# Patient Record
Sex: Male | Born: 1960 | Race: White | Hispanic: No | Marital: Married | Smoking: Former smoker
Health system: Southern US, Community
[De-identification: ages and names within clinical notes are randomized; demographics above are authoritative.]

## PROBLEM LIST (undated history)

## (undated) DIAGNOSIS — I25110 Atherosclerotic heart disease of native coronary artery with unstable angina pectoris: Secondary | ICD-10-CM

## (undated) DIAGNOSIS — I214 Non-ST elevation (NSTEMI) myocardial infarction: Secondary | ICD-10-CM

## (undated) DIAGNOSIS — Z720 Tobacco use: Secondary | ICD-10-CM

## (undated) DIAGNOSIS — E785 Hyperlipidemia, unspecified: Secondary | ICD-10-CM

## (undated) DIAGNOSIS — I4891 Unspecified atrial fibrillation: Secondary | ICD-10-CM

## (undated) DIAGNOSIS — I10 Essential (primary) hypertension: Secondary | ICD-10-CM

## (undated) HISTORY — DX: Hyperlipidemia, unspecified: E78.5

## (undated) HISTORY — DX: Non-ST elevation (NSTEMI) myocardial infarction: I21.4

## (undated) HISTORY — DX: Essential (primary) hypertension: I10

## (undated) HISTORY — DX: Tobacco use: Z72.0

## (undated) HISTORY — DX: Atherosclerotic heart disease of native coronary artery with unstable angina pectoris: I25.110

---

## 1998-12-10 ENCOUNTER — Emergency Department (HOSPITAL_COMMUNITY): Admission: EM | Admit: 1998-12-10 | Discharge: 1998-12-11 | Payer: Self-pay | Admitting: Emergency Medicine

## 1998-12-10 ENCOUNTER — Encounter: Payer: Self-pay | Admitting: Emergency Medicine

## 2011-07-09 ENCOUNTER — Encounter: Payer: Self-pay | Admitting: Physician Assistant

## 2011-07-09 ENCOUNTER — Ambulatory Visit: Payer: BC Managed Care – PPO | Admitting: Physician Assistant

## 2011-07-09 VITALS — BP 139/100 | HR 92 | Temp 97.9°F | Resp 18 | Ht 70.0 in | Wt 243.4 lb

## 2011-07-09 DIAGNOSIS — I10 Essential (primary) hypertension: Secondary | ICD-10-CM

## 2011-07-09 MED ORDER — HYDROCHLOROTHIAZIDE 12.5 MG PO CAPS: ORAL_CAPSULE | ORAL | Status: DC

## 2011-07-09 NOTE — Progress Notes (Signed)
Subjective:    Patient ID: Paul Yang, male    DOB: 09/03/1960, 51 y.o.   MRN: [id number]  Primary Physician: Eula Listen, PA-C  Chief Complaint: HTN follow up  HPI 51 y.o. y/o Caucasian male with history noted below presents for follow up and management of his hypertension. Taking Lisinopril 10 mg 2 each morning and 1 each night, as well as Norvasc 10 mg daily. Tolerating both medications. At home BP's run around 140's-150's/ 90's-100's. Takes medication daily. Has FAA flight physical in 2 weeks. Still smoking, not ready to quit yet. Could not take Chantix secondary to [employer] regulations. Otherwise doing well, with no issues or complaints. Over due for CPE.  No CP, HA, vision changes, or focal deficits.   Past Medical History  Diagnosis Date  . Hypertension   . Hyperlipidemia   . Tobacco use     Prior to Admission medications   Medication Sig Start Date End Date Taking? Authorizing Provider  amLODipine (NORVASC) 10 MG tablet Take 10 mg by mouth daily.   Yes Historical Provider, MD  lisinopril (PRINIVIL,ZESTRIL) 10 MG tablet Take 10 mg by mouth daily. 2 po q am, 1 po q pm   Yes Historical Provider, MD           No Known Allergies  History   Social History  . Marital Status: Divorced    Spouse Name: N/A    Number of Children: N/A  . Years of Education: N/A   Social History Main Topics  . Smoking status: Current Everyday Smoker  . Smokeless tobacco: None  . Alcohol Use: No  . Drug Use: No  . Sexually Active: Girlfriend   Other Topics Concern  . None   Social History Narrative  . None    Family History  Problem Relation Age of Onset  . Hypertension Mother        Review of Systems  Constitutional: Negative for fever, chills, diaphoresis, activity change, appetite change and fatigue.  Eyes: Negative for visual disturbance.  Respiratory: Negative for cough.   Cardiovascular: Negative for chest pain.  Neurological: Negative for dizziness, light-headedness,  numbness and headaches.       Objective:   Physical Exam  Constitutional: He is oriented to person, place, and time. He appears well-developed and well-nourished. No distress.  HENT:  Head: Normocephalic and atraumatic.  Right Ear: External ear normal.  Left Ear: External ear normal.  Mouth/Throat: Oropharynx is clear and moist.  Eyes: Conjunctivae and EOM are normal. Pupils are equal, round, and reactive to light. Right eye exhibits no discharge. Left eye exhibits no discharge. No scleral icterus.  Neck: Normal range of motion. Neck supple. No tracheal deviation present. No thyromegaly present.  Cardiovascular: Normal rate, regular rhythm and normal heart sounds.  Exam reveals no gallop and no friction rub.   No murmur heard. Pulmonary/Chest: Effort normal and breath sounds normal. No respiratory distress. He has no wheezes. He has no rales. He exhibits no tenderness.  Lymphadenopathy:    He has no cervical adenopathy.  Neurological: He is alert and oriented to person, place, and time.  Skin: Skin is warm and dry. He is not diaphoretic.  Psychiatric: He has a normal mood and affect. His behavior is normal. Judgment and thought content normal.    Labs: Deferred until 07/13/11 visit      Assessment & Plan:  51 y.o. Caucasian male with HTN. 1. Add HCTZ 12.5 mg 1 PO daily for 3 days, then 2 PO  daily, # 180 RF#1 2. Continue Lisinopril 10 mg 2 PO QAM, and 1 PO QPM, has refills at home 3. Continue Norvasc 10 mg daily, has refills at home 4. Stop smoking 5. Recheck BP in 5 days, with at home readings. Plan to check labs at visit on 07/13/11. 6. Needs CPE with fasting blood work.  Signed,  Eula Listen, PA-C 07/09/2011, 2:26 PM

## 2011-07-09 NOTE — Patient Instructions (Signed)

## 2011-07-13 ENCOUNTER — Ambulatory Visit: Payer: BC Managed Care – PPO | Admitting: Physician Assistant

## 2011-07-13 VITALS — BP 118/82 | HR 86 | Temp 98.3°F | Resp 16 | Ht 70.75 in | Wt 244.0 lb

## 2011-07-13 DIAGNOSIS — I10 Essential (primary) hypertension: Secondary | ICD-10-CM

## 2011-07-13 LAB — BASIC METABOLIC PANEL
BUN: 15 mg/dL (ref 6–23)
CO2: 26 mEq/L (ref 19–32)
Calcium: 9.5 mg/dL (ref 8.4–10.5)
Chloride: 102 mEq/L (ref 96–112)
Creat: 1.31 mg/dL (ref 0.50–1.35)
Glucose, Bld: 88 mg/dL (ref 70–99)
Potassium: 4.1 mEq/L (ref 3.5–5.3)
Sodium: 140 mEq/L (ref 135–145)

## 2011-07-13 NOTE — Patient Instructions (Signed)

## 2011-07-13 NOTE — Progress Notes (Signed)
Patient ID: Paul Yang MRN: [id number], DOB: May 28, 1961, 51 y.o. Date of Encounter: 07/13/2011, 2:14 PM  Primary Physician: No primary provider on file.  Chief Complaint: Follow up HTN  HPI: 51 y/o Caucasian male with history below presents for follow up HTN. Doing well. No issues or complaints. Tolerating all medications. Started his HCTZ on 07/09/11. Now up to 25 mg daily of HCTZ. No side effects. BP at home has been 120's/80's. No CP, HA, vision changes, or focal deficits.  Plans to schedule CPE in March.  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    Past Medical History  Diagnosis Date  . Hypertension   . Hyperlipidemia   . Tobacco use      Home Meds: Prior to Admission medications   Medication Sig Start Date End Date Taking? Authorizing Provider  amLODipine (NORVASC) 10 MG tablet Take 10 mg by mouth daily.   Yes Historical Provider, MD  hydrochlorothiazide (MICROZIDE) 12.5 MG capsule 1 PO daily for 3 days then 2 po daily 07/09/11  Yes Abriana Saltos, PA-C  lisinopril (PRINIVIL,ZESTRIL) 10 MG tablet Take 10 mg by mouth daily. 2 po q am, 1 po q pm   Yes Historical Provider, MD    Allergies: No Known Allergies  History   Social History  . Marital Status: Married    Spouse Name: N/A    Number of Children: N/A  . Years of Education: N/A   Occupational History  . Not on file.   Social History Main Topics  . Smoking status: Current Everyday Smoker  . Smokeless tobacco: Not on file  . Alcohol Use: No  . Drug Use: No  . Sexually Active: Not on file   Other Topics Concern  . Not on file   Social History Narrative  . No narrative on file     Review of Systems: Constitutional: negative for chills, fever, night sweats, weight changes, or fatigue  HEENT: negative for vision changes, hearing loss, congestion, rhinorrhea, ST, epistaxis, or sinus pressure Cardiovascular: negative for chest pain or palpitations Respiratory: negative for hemoptysis, wheezing, shortness  of breath, or cough Abdominal: negative for abdominal pain, nausea, vomiting, diarrhea, or constipation Dermatological: negative for rash Neurologic: negative for headache, dizziness, or syncope All other systems reviewed and are otherwise negative with the exception to those above and in the HPI.   Physical Exam: Blood pressure 118/82, pulse 86, temperature 98.3 F (36.8 C), temperature source Oral, resp. rate 16, height 5' 10.75" (1.797 m), weight 244 lb (110.678 kg). Body mass index is 34.27 kg/(m^2). General: Well developed, well nourished, in no acute distress. Head: Normocephalic, atraumatic, eyes without discharge, sclera non-icteric, nares are without discharge. Bilateral auditory canals clear, TM's are without perforation, pearly grey and translucent with reflective cone of light bilaterally. Oral cavity moist, posterior pharynx without exudate, erythema, peritonsillar abscess, or post nasal drip.  Neck: Supple. No thyromegaly. Full ROM. No lymphadenopathy. Lungs: Clear bilaterally to auscultation without wheezes, rales, or rhonchi. Breathing is unlabored. Heart: RRR with S1 S2. No murmurs, rubs, or gallops appreciated. Msk:  Strength and tone normal for age. Extremities/Skin: Warm and dry. No clubbing or cyanosis. No edema. No rashes or suspicious lesions. Neuro: Alert and oriented X 3. Moves all extremities spontaneously. Gait is normal. CNII-XII grossly in tact. Psych:  Responds to questions appropriately with a normal affect.   Labs: Bmet pending. Patient is not fasting.  ASSESSMENT AND PLAN:  51 y.o. Caucasian male with well controlled HTN. 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

## 2011-07-18 ENCOUNTER — Other Ambulatory Visit: Payer: Self-pay | Admitting: Physician Assistant

## 2011-07-18 MED ORDER — HYDROCHLOROTHIAZIDE 12.5 MG PO TABS: 12.5000 mg | ORAL_TABLET | Freq: Every day | ORAL | Status: DC

## 2011-07-18 NOTE — Progress Notes (Addendum)
We are telling the patient that his new HCTZ 12.5 mg tabs are ready for pick up at the pharmacy.  Shaunette Gassner  Done. Please notify patient.  Henrick Mcgue

## 2011-07-20 NOTE — Progress Notes (Signed)
Notified pt that Rx ready for p/up

## 2012-01-14 ENCOUNTER — Encounter: Payer: Self-pay | Admitting: Physician Assistant

## 2012-01-14 ENCOUNTER — Ambulatory Visit (INDEPENDENT_AMBULATORY_CARE_PROVIDER_SITE_OTHER): Payer: BC Managed Care – PPO | Admitting: Physician Assistant

## 2012-01-14 VITALS — BP 138/92 | HR 91 | Temp 98.3°F | Resp 16 | Ht 70.5 in | Wt 242.2 lb

## 2012-01-14 DIAGNOSIS — I10 Essential (primary) hypertension: Secondary | ICD-10-CM

## 2012-01-14 DIAGNOSIS — Z0000 Encounter for general adult medical examination without abnormal findings: Secondary | ICD-10-CM

## 2012-01-14 LAB — POCT URINALYSIS DIPSTICK
Ketones, UA: NEGATIVE
Protein, UA: 30
Spec Grav, UA: >=1.03
pH, UA: 5.5

## 2012-01-14 LAB — POCT UA - MICROSCOPIC ONLY
Bacteria, U Microscopic: NEGATIVE
Crystals, Ur, HPF, POC: NEGATIVE

## 2012-01-14 NOTE — Progress Notes (Signed)
Patient ID: Paul Yang MRN: [id number], DOB: Dec 02, 1960 51 y.o. Date of Encounter: 01/14/2012, 3:36 PM  Primary Physician: No primary provider on file.  Chief Complaint: Physical (CPE)  HPI: 51 y.o. y/o male with history noted below here for CPE. Doing well. No issues/complaints. Taking Norvasc 10 mg daily, and Lisinopril 10 mg 2 po qam daily. Not taking HCTZ secondary to increased urine production and not taking Lisinopril 10 mg at night. Eating healthier. Exercising regularly. Still smoking, but trying to quit, does not want anything for this at this time. Vaccinations up to date. Stress levels are improving. Plans to get his colonoscopy in March 2014 when he turns 51.   Review of Systems: Consitutional: No fever, chills, fatigue, night sweats, lymphadenopathy, or weight changes. Eyes: No visual changes, eye redness, or discharge. ENT/Mouth: Ears: No otalgia, tinnitus, hearing loss, discharge. Nose: No congestion, rhinorrhea, sinus pain, or epistaxis. Throat: No sore throat, post nasal drip, or teeth pain. Cardiovascular: No CP, palpitations, diaphoresis, DOE, edema, orthopnea, PND. Respiratory: No cough, hemoptysis, SOB, or wheezing. Gastrointestinal: No anorexia, dysphagia, reflux, pain, nausea, vomiting, hematemesis, diarrhea, constipation, BRBPR, or melena. Genitourinary: No dysuria, frequency, urgency, hematuria, incontinence, nocturia, decreased urinary stream, discharge, impotence, or testicular pain/masses. Musculoskeletal: No decreased ROM, myalgias, stiffness, joint swelling, or weakness. Skin: No rash, erythema, lesion changes, pain, warmth, jaundice, or pruritis. Neurological: No headache, dizziness, syncope, seizures, tremors, memory loss, coordination problems, or paresthesias. Psychological: No anxiety, depression, hallucinations, SI/HI. Endocrine: No fatigue, polydipsia, polyphagia, polyuria, or known diabetes. All other systems were reviewed and are otherwise  negative.  Past Medical History  Diagnosis Date  . Hypertension   . Hyperlipidemia   . Tobacco use      History reviewed. No pertinent past surgical history.  Home Meds:  Prior to Admission medications   Medication Sig Start Date End Date Taking? Authorizing Provider  amLODipine (NORVASC) 10 MG tablet Take 10 mg by mouth daily.   Yes Historical Provider, MD  lisinopril (PRINIVIL,ZESTRIL) 10 MG tablet Take 10 mg by mouth daily. 2 po q am, 1 po q pm   Yes Historical Provider, MD  Multiple Vitamin (MULTIVITAMIN) tablet Take 1 tablet by mouth daily.   Yes Historical Provider, MD           Allergies: No Known Allergies  History   Social History  . Marital Status: Married    Spouse Name: N/A    Number of Children: N/A  . Years of Education: N/A   Occupational History  . Not on file.   Social History Main Topics  . Smoking status: Current Everyday Smoker  . Smokeless tobacco: Never Used  . Alcohol Use: No  . Drug Use: No  . Sexually Active: Yes -- Male partner(s)   Other Topics Concern  . Not on file   Social History Narrative  . No narrative on file    Family History  Problem Relation Age of Onset  . Hypertension Mother     Physical Exam: Blood pressure 138/92, pulse 91, temperature 98.3 F (36.8 C), temperature source Oral, resp. rate 16, height 5' 10.5" (1.791 m), weight 242 lb 3.2 oz (109.861 kg), SpO2 97.00%.  General: Well developed, well nourished, in no acute distress. HEENT: Normocephalic, atraumatic. Conjunctiva pink, sclera non-icteric. Pupils 2 mm constricting to 1 mm, round, regular, and equally reactive to light and accomodation. EOMI. Internal auditory canal clear. TMs with good cone of light and without pathology. Nasal mucosa pink. Nares are without discharge. No sinus  tenderness. Oral mucosa pink. Dentition normal. Pharynx without exudate.   Neck: Supple. Trachea midline. No thyromegaly. Full ROM. No lymphadenopathy. Lungs: Clear to auscultation  bilaterally without wheezes, rales, or rhonchi. Breathing is of normal effort and unlabored. Cardiovascular: RRR with S1 S2. No murmurs, rubs, or gallops appreciated. Distal pulses 2+ symmetrically. No carotid or abdominal bruits. Abdomen: Soft, non-tender, non-distended with normoactive bowel sounds. No hepatosplenomegaly or masses. No rebound/guarding. No CVA tenderness. Without hernias.  Rectal: No external hemorrhoids or fissures. Rectal vault without masses. Prostate smooth and symmetrical without masses, lesions, or TTP.  Genitourinary: Circumcised male. No penile lesions. Testes descended bilaterally, and smooth without tenderness or masses.  Musculoskeletal: Full range of motion and 5/5 strength throughout. Without swelling, atrophy, tenderness, crepitus, or warmth. Extremities without clubbing, cyanosis, or edema. Calves supple. Skin: Warm and moist without erythema, ecchymosis, wounds, or rash. Neuro: A+Ox3. CN II-XII grossly intact. Moves all extremities spontaneously. Full sensation throughout. Normal gait. DTR 2+ throughout upper and lower extremities. Finger to nose intact. Psych:  Responds to questions appropriately with a normal affect.   Studies:  Results for orders placed in visit on 01/14/12  POCT UA - MICROSCOPIC ONLY      Component Value Range   WBC, Ur, HPF, POC 0-1     RBC, urine, microscopic 0-2     Bacteria, U Microscopic neg     Mucus, UA trace     Epithelial cells, urine per micros 0-1     Crystals, Ur, HPF, POC neg     Casts, Ur, LPF, POC 0-1     Yeast, UA neg    POCT URINALYSIS DIPSTICK      Component Value Range   Color, UA yellow     Clarity, UA clear     Glucose, UA neg     Bilirubin, UA small     Ketones, UA neg     Spec Grav, UA >=1.030     Blood, UA trace     pH, UA 5.5     Protein, UA 30     Urobilinogen, UA 0.2     Nitrite, UA neg     Leukocytes, UA Negative    IFOBT (OCCULT BLOOD)      Component Value Range   IFOBT Negative      CBC,  CMET, Lipid, PSA, TSH all pending. Patient is fasting.   EKG: NSR, no acute changes from previous.   Assessment/Plan:  51 y.o. y/o Caucasian male here for CPE with HTN 1. HTN -Continue current meds -BP improved with recheck today in office -Does not need refills today, will call when he does -Ok to refill through March 2014 -Continue with healthy diet and exercise -Stop smoking  2. CPE -Vaccinations up to date -Await labs, treatment and follow up pending -Plan to schedule colonoscopy March 2014 when turns 52 -Anticipatory guidance   Signed, Eula Listen, PA-C 01/14/2012 3:36 PM

## 2012-01-14 NOTE — Progress Notes (Signed)
  Subjective:    Patient ID: Paul Yang, male    DOB: 05-Jun-1960, 51 y.o.   MRN: [id number]  HPI    Review of Systems  Constitutional: Negative.   HENT: Negative.   Eyes: Negative.   Respiratory: Negative.   Cardiovascular: Negative.   Gastrointestinal: Negative.   Genitourinary: Negative.   Musculoskeletal: Negative.   Skin: Negative.   Neurological: Negative.   Hematological: Negative.   Psychiatric/Behavioral: Negative.        Objective:   Physical Exam        Assessment & Plan:

## 2012-01-15 ENCOUNTER — Other Ambulatory Visit: Payer: Self-pay | Admitting: Physician Assistant

## 2012-01-15 LAB — COMPREHENSIVE METABOLIC PANEL
ALT: 33 U/L (ref 0–53)
AST: 21 U/L (ref 0–37)
Albumin: 4 g/dL (ref 3.5–5.2)
Alkaline Phosphatase: 101 U/L (ref 39–117)
BUN: 12 mg/dL (ref 6–23)
CO2: 25 mEq/L (ref 19–32)
Calcium: 8.8 mg/dL (ref 8.4–10.5)
Chloride: 109 mEq/L (ref 96–112)
Creat: 0.96 mg/dL (ref 0.50–1.35)
Glucose, Bld: 89 mg/dL (ref 70–99)
Potassium: 4 mEq/L (ref 3.5–5.3)
Sodium: 143 mEq/L (ref 135–145)
Total Bilirubin: 0.7 mg/dL (ref 0.3–1.2)
Total Protein: 6.4 g/dL (ref 6.0–8.3)

## 2012-01-15 LAB — CBC
HCT: 46.4 % (ref 39.0–52.0)
Hemoglobin: 16.4 g/dL (ref 13.0–17.0)
MCH: 31 pg (ref 26.0–34.0)
MCHC: 35.3 g/dL (ref 30.0–36.0)
MCV: 87.7 fL (ref 78.0–100.0)
Platelets: 224 10*3/uL (ref 150–400)
RBC: 5.29 MIL/uL (ref 4.22–5.81)
RDW: 14.3 % (ref 11.5–15.5)
WBC: 6.9 10*3/uL (ref 4.0–10.5)

## 2012-01-15 LAB — LIPID PANEL
Cholesterol: 220 mg/dL — ABNORMAL HIGH (ref 0–200)
HDL: 27 mg/dL — ABNORMAL LOW (ref >39)
LDL Cholesterol: 123 mg/dL — ABNORMAL HIGH (ref 0–99)
Total CHOL/HDL Ratio: 8.1 Ratio
Triglycerides: 349 mg/dL — ABNORMAL HIGH (ref <150)
VLDL: 70 mg/dL — ABNORMAL HIGH (ref 0–40)

## 2012-01-15 MED ORDER — ATORVASTATIN CALCIUM 10 MG PO TABS: 10.0000 mg | ORAL_TABLET | Freq: Every day | ORAL | Status: DC

## 2012-05-20 ENCOUNTER — Other Ambulatory Visit: Payer: Self-pay | Admitting: Physician Assistant

## 2012-06-17 ENCOUNTER — Encounter: Payer: Self-pay | Admitting: Family Medicine

## 2012-06-17 ENCOUNTER — Ambulatory Visit: Payer: BC Managed Care – PPO | Admitting: Family Medicine

## 2012-06-17 VITALS — BP 150/100 | HR 75 | Temp 97.8°F | Resp 16 | Ht 71.0 in | Wt 250.6 lb

## 2012-06-17 DIAGNOSIS — E785 Hyperlipidemia, unspecified: Secondary | ICD-10-CM

## 2012-06-17 DIAGNOSIS — E669 Obesity, unspecified: Secondary | ICD-10-CM | POA: Insufficient documentation

## 2012-06-17 DIAGNOSIS — I10 Essential (primary) hypertension: Secondary | ICD-10-CM

## 2012-06-17 MED ORDER — AMLODIPINE BESYLATE 10 MG PO TABS: 10.0000 mg | ORAL_TABLET | Freq: Every day | ORAL | Status: DC

## 2012-06-17 MED ORDER — HYDROCHLOROTHIAZIDE 12.5 MG PO TABS: 12.5000 mg | ORAL_TABLET | Freq: Every day | ORAL | Status: DC

## 2012-06-17 MED ORDER — LISINOPRIL 10 MG PO TABS: 10.0000 mg | ORAL_TABLET | Freq: Every day | ORAL | Status: DC

## 2012-06-17 NOTE — Progress Notes (Signed)
S: This 52 y.o. Cauc make who has HTN; unfortunately, he is not sure what he is supposed to be taking. He has no CP or tightness, palpitations, SOB or DOE, dizziness, weakness, numbness or syncope. He is a Recruitment consultant; he has occasional tension HA w/ pain localized to posterior right neck. This is treated by Chiropractor.  ROS: Noncontributory.  O:  Filed Vitals:   06/17/12 1309  BP: 150/100  Pulse: 75  Temp: 97.8 F (36.6 C)  Resp: 16   GEN: In NAD; WN,WD. HENT: [place]/AT; EOMI w/ clear conj/sclerae. Otherwise unremarkable. COR: RRR. LUNGS: Normal resp rate and effort. NEURO: A&O x 3; Cns intact. Nonfocal.  A?P:  1. HTN (hypertension)                     Reviewed medications; refilled all meds for 90 days but pt must RTC in March 2014 for visit w/ Eula Listen, PA  2. Hyperlipidemia LDL goal < 100       Reviewed pt's lipid results from Aug 2013; advised of elevated CHD risk due to high TChol and LDL chol with low HDL. He states that he needs to make some changes. He will continue to take statin.

## 2012-06-17 NOTE — Patient Instructions (Signed)
I have refilled all BP medications for 90-day supply. However, you will still need to see Paul Yang in March; labs will probably be done at that visit.   For tension HA, it is best if you use OTC topical analgesic (like Aspercreme, Arnica Gel or Flexall); you can apply these 2-3 times a day. You can take Tylenol for pain.  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

## 2012-08-21 ENCOUNTER — Telehealth: Payer: Self-pay

## 2012-08-21 ENCOUNTER — Other Ambulatory Visit: Payer: Self-pay | Admitting: Physician Assistant

## 2012-08-21 DIAGNOSIS — Z1211 Encounter for screening for malignant neoplasm of colon: Secondary | ICD-10-CM

## 2012-08-21 MED ORDER — ATORVASTATIN CALCIUM 10 MG PO TABS: 10.0000 mg | ORAL_TABLET | Freq: Every day | ORAL | Status: DC

## 2012-08-21 NOTE — Telephone Encounter (Signed)
Patient advised.

## 2012-08-21 NOTE — Telephone Encounter (Signed)
Sent in the Lipitor, please advise on Colonoscopy

## 2012-08-21 NOTE — Telephone Encounter (Signed)
Pt is scheduled for a cpe with ryan on 09/08/12. Pt is asking if we can go ahead and get his colonoscopy scheduled before the cpe. Pt also states he is out of his lipitor and is requesting a refill

## 2012-08-21 NOTE — Telephone Encounter (Signed)
GI/colonoscopy referral done. Lipitor refill done.

## 2012-09-04 LAB — CBC AND DIFFERENTIAL
HCT: 45 % (ref 41–53)
Hemoglobin: 15.5 g/dL (ref 13.5–17.5)
Platelets: 246 10*3/uL (ref 150–399)
WBC: 9.1 10^3/mL

## 2012-09-04 LAB — HEPATIC FUNCTION PANEL
ALT: 37 U/L (ref 10–40)
AST: 21 U/L (ref 14–40)
Bilirubin, Direct: 0.1 mg/dL (ref 0.01–0.4)

## 2012-09-04 LAB — BASIC METABOLIC PANEL: Potassium: 3.7 mmol/L (ref 3.4–5.3)

## 2012-09-04 LAB — TSH: TSH: 0.714

## 2012-09-08 ENCOUNTER — Ambulatory Visit (INDEPENDENT_AMBULATORY_CARE_PROVIDER_SITE_OTHER): Payer: BC Managed Care – PPO | Admitting: Physician Assistant

## 2012-09-08 ENCOUNTER — Encounter: Payer: Self-pay | Admitting: Physician Assistant

## 2012-09-08 VITALS — BP 138/88 | HR 98 | Temp 98.0°F | Resp 18 | Ht 70.0 in | Wt 251.2 lb

## 2012-09-08 DIAGNOSIS — I10 Essential (primary) hypertension: Secondary | ICD-10-CM

## 2012-09-08 DIAGNOSIS — Z0000 Encounter for general adult medical examination without abnormal findings: Secondary | ICD-10-CM

## 2012-09-08 DIAGNOSIS — E785 Hyperlipidemia, unspecified: Secondary | ICD-10-CM

## 2012-09-08 LAB — LIPID PANEL
HDL: 26 mg/dL — ABNORMAL LOW (ref >39)
LDL Cholesterol: 117 mg/dL — ABNORMAL HIGH (ref 0–99)

## 2012-09-08 NOTE — Progress Notes (Signed)
Patient ID: AZLAAN ISIDORE MRN: [id number], DOB: [birth year] 52 y.o. Date of Encounter: 09/09/2012, 9:10 AM  Primary Physician: No primary provider on file.  Chief Complaint: Physical (CPE)  HPI: 52 y.o. male with history noted below here for CPE. Doing well. No issues/complaints.   1) Hypertension: Taking Lisinopril 10 mg 1 po daily, HCTZ 12.5 mg 1 po daily, and Norvasc 10 mg 1 po daily. Tolerating medications without adverse effects. Blood pressures outside of our office typically run around 130's/80's, or better. No chest pain, chest tightness, SOB, dizziness, headaches, vision changes, or focal deficits.  2) Hyperlipidemia: Tolerating Lipitor 10 mg daily without issue. No myalgias. Eats a healthy diet. Does try to get some exercise in when he can.   3) Tobacco use: He did quit for 30 days, but picked it back up secondary to stress. Not yet ready to quit again.   4) CPE: Has colonoscopy scheduled for 10/15/12. Influenza vaccine up to date. Tetanus vaccine up to date.    Review of Systems: Consitutional: No fever, chills, fatigue, night sweats, lymphadenopathy, or weight changes. Eyes: No visual changes, eye redness, or discharge. ENT/Mouth: Ears: No otalgia, tinnitus, hearing loss, discharge. Nose: No congestion, rhinorrhea, sinus pain, or epistaxis. Throat: No sore throat, post nasal drip, or teeth pain. Cardiovascular: No CP, palpitations, diaphoresis, DOE, edema, orthopnea, PND. Respiratory: No cough, hemoptysis, SOB, or wheezing. Gastrointestinal: No anorexia, dysphagia, reflux, pain, nausea, vomiting, hematemesis, diarrhea, constipation, BRBPR, or melena. Genitourinary: No dysuria, frequency, urgency, hematuria, incontinence, nocturia, decreased urinary stream, discharge, impotence, or testicular pain/masses. Musculoskeletal: No decreased ROM, myalgias, stiffness, joint swelling, or weakness. Skin: No rash, erythema, lesion changes, pain, warmth, jaundice, or pruritis. Neurological:  No headache, dizziness, syncope, seizures, tremors, memory loss, coordination problems, or paresthesias. Psychological: No anxiety, depression, hallucinations, SI/HI. Endocrine: No fatigue, polydipsia, polyphagia, polyuria, or known diabetes. All other systems were reviewed and are otherwise negative.  Past Medical History  Diagnosis Date  . Hypertension   . Hyperlipidemia   . Tobacco use      History reviewed. No pertinent past surgical history.  Home Meds:  Prior to Admission medications   Medication Sig Start Date End Date Taking? Authorizing Provider  amLODipine (NORVASC) 10 MG tablet Take 1 tablet (10 mg total) by mouth daily. Needs office visit in March 2014 06/17/12  Yes Maurice March, MD  atorvastatin (LIPITOR) 10 MG tablet Take 1 tablet (10 mg total) by mouth daily. 08/21/12  Yes Infinity Jeffords M Isidora Laham, PA-C  hydrochlorothiazide (HYDRODIURIL) 12.5 MG tablet Take 1 tablet (12.5 mg total) by mouth daily. 06/17/12 06/17/13 Yes Maurice March, MD  lisinopril (PRINIVIL,ZESTRIL) 10 MG tablet Take 1 tablet (10 mg total) by mouth daily. 2 po q am, 1 po q pm 06/17/12  Yes Maurice March, MD  Multiple Vitamin (MULTIVITAMIN) tablet Take 1 tablet by mouth daily.   Yes Historical Provider, MD    Allergies: No Known Allergies  History   Social History  . Marital Status: Married    Spouse Name: N/A    Number of Children: N/A  . Years of Education: N/A   Occupational History  . Not on file.   Social History Main Topics  . Smoking status: Current Every Day Smoker  . Smokeless tobacco: Never Used  . Alcohol Use: No  . Drug Use: No  . Sexually Active: Yes -- Male partner(s)   Other Topics Concern  . Not on file   Social History Narrative  . No narrative on  file    Family History  Problem Relation Age of Onset  . Hypertension Mother     Physical Exam: Blood pressure 138/88, pulse 98, temperature 98 F (36.7 C), temperature source Oral, resp. rate 18, height 5\' 10"   (1.778 m), weight 251 lb 3.2 oz (113.944 kg), SpO2 98.00%.  General: Well developed, well nourished, in no acute distress. HEENT: Normocephalic, atraumatic. Conjunctiva pink, sclera non-icteric. Pupils 2 mm constricting to 1 mm, round, regular, and equally reactive to light and accomodation. EOMI. Internal auditory canal clear. TMs with good cone of light and without pathology. Nasal mucosa pink. Nares are without discharge. No sinus tenderness. Oral mucosa pink. Dentition normal. Pharynx without exudate.   Neck: Supple. Trachea midline. No thyromegaly. Full ROM. No lymphadenopathy. Lungs: Clear to auscultation bilaterally without wheezes, rales, or rhonchi. Breathing is of normal effort and unlabored. Cardiovascular: RRR with S1 S2. No murmurs, rubs, or gallops appreciated. Distal pulses 2+ symmetrically. No carotid or abdominal bruits. Abdomen: Soft, non-tender, non-distended with normoactive bowel sounds. No hepatosplenomegaly or masses. No rebound/guarding. No CVA tenderness. Without hernias.  Rectal: Patient declined. Stating this was just performed by Dr. Loreta Ave.   Genitourinary: Circumcised male. No penile lesions. Testes descended bilaterally, and smooth without tenderness or masses.  Musculoskeletal: Full range of motion and 5/5 strength throughout. Without swelling, atrophy, tenderness, crepitus, or warmth. Extremities without clubbing, cyanosis, or edema. Calves supple. Skin: Warm and moist without erythema, ecchymosis, wounds, or rash. Neuro: A+Ox3. CN II-XII grossly intact. Moves all extremities spontaneously. Full sensation throughout. Normal gait. DTR 2+ throughout upper and lower extremities. Finger to nose intact. Psych:  Responds to questions appropriately with a normal affect.   Studies:  Will be abstracted in.  Reviewed and all unremarkable.  BMP, CBC, LFT, TSH, and PSA. FLP pending  EKG: NSR  Assessment/Plan:  52 y.o. caucasian male here for CPE with history of hypertension,  hyperlipidemia, and tobacco use. 1) Hypertension  -Continue current treatment  -Lisinopril 10 mg 2 po q am, 1 po q pm #270 RF 1 -HCTZ 12.5 1 po daily #90 RF 1 -Norvasc 10 mg 1 po daily #90 RF 1 -Healthy diet and exercise -Weight loss  2) Hyperlipidemia -Await lipid panel -Lipitor 10 mg 1 po daily #30 RF 11 -Healthy diet and exercise -Weight loss  3) Tobacco use -Counseled patient on smoking cessation  4) CPE -Colonoscopy scheduled for 10/15/12 -Influenza up to date -Tetanus -Labs drawn by Dr. Loreta Ave to be abstracted in  Signed, Eula Listen, PA-C 09/09/2012 9:10 AM

## 2012-09-09 ENCOUNTER — Encounter: Payer: Self-pay | Admitting: Physician Assistant

## 2012-09-09 MED ORDER — AMLODIPINE BESYLATE 10 MG PO TABS: 10.0000 mg | ORAL_TABLET | Freq: Every day | ORAL | Status: DC

## 2012-09-09 MED ORDER — HYDROCHLOROTHIAZIDE 12.5 MG PO TABS: 12.5000 mg | ORAL_TABLET | Freq: Every day | ORAL | Status: AC

## 2012-09-09 MED ORDER — LISINOPRIL 10 MG PO TABS: 10.0000 mg | ORAL_TABLET | Freq: Every day | ORAL | Status: DC

## 2012-09-09 MED ORDER — ATORVASTATIN CALCIUM 10 MG PO TABS: 10.0000 mg | ORAL_TABLET | Freq: Every day | ORAL | Status: DC

## 2012-09-10 ENCOUNTER — Encounter: Payer: Self-pay | Admitting: Physician Assistant

## 2012-09-17 ENCOUNTER — Encounter: Payer: Self-pay | Admitting: Physician Assistant

## 2012-11-06 ENCOUNTER — Telehealth: Payer: Self-pay | Admitting: Radiology

## 2012-11-06 NOTE — Telephone Encounter (Signed)
Patient advised the area removed from colonoscopy benign/  polyp

## 2012-11-28 ENCOUNTER — Encounter: Payer: Self-pay | Admitting: Physician Assistant

## 2013-03-05 NOTE — Progress Notes (Signed)
Pt dropped off [employer] form for ins. I completed and faxed to ins except for waist circumference not measured at OV. Pt stated he wasn't worried about the waist circ and req'd that I send in w/out. Got confirmation on fax and scanned. Mailed original to pt w/confirmation of fax.

## 2013-03-06 ENCOUNTER — Ambulatory Visit: Payer: BC Managed Care – PPO | Admitting: Family Medicine

## 2013-11-23 ENCOUNTER — Emergency Department (HOSPITAL_COMMUNITY)
Admission: EM | Admit: 2013-11-23 | Discharge: 2013-11-23 | Disposition: A | Discharge status: Home / Self Care | Payer: Worker's Compensation | Attending: Emergency Medicine | Admitting: Emergency Medicine

## 2013-11-23 ENCOUNTER — Other Ambulatory Visit: Payer: Self-pay | Admitting: Physician Assistant

## 2013-11-23 DIAGNOSIS — I10 Essential (primary) hypertension: Secondary | ICD-10-CM

## 2013-11-23 DIAGNOSIS — Z7982 Long term (current) use of aspirin: Secondary | ICD-10-CM | POA: Insufficient documentation

## 2013-11-23 DIAGNOSIS — Y9389 Activity, other specified: Secondary | ICD-10-CM | POA: Insufficient documentation

## 2013-11-23 DIAGNOSIS — Y9289 Other specified places as the place of occurrence of the external cause: Secondary | ICD-10-CM | POA: Insufficient documentation

## 2013-11-23 DIAGNOSIS — Z79899 Other long term (current) drug therapy: Secondary | ICD-10-CM | POA: Insufficient documentation

## 2013-11-23 DIAGNOSIS — IMO0002 Reserved for concepts with insufficient information to code with codable children: Secondary | ICD-10-CM | POA: Insufficient documentation

## 2013-11-23 DIAGNOSIS — Y990 Civilian activity done for income or pay: Secondary | ICD-10-CM | POA: Insufficient documentation

## 2013-11-23 DIAGNOSIS — W010XXA Fall on same level from slipping, tripping and stumbling without subsequent striking against object, initial encounter: Secondary | ICD-10-CM | POA: Insufficient documentation

## 2013-11-23 DIAGNOSIS — M62830 Muscle spasm of back: Secondary | ICD-10-CM

## 2013-11-23 DIAGNOSIS — F17200 Nicotine dependence, unspecified, uncomplicated: Secondary | ICD-10-CM | POA: Insufficient documentation

## 2013-11-23 DIAGNOSIS — E785 Hyperlipidemia, unspecified: Secondary | ICD-10-CM | POA: Insufficient documentation

## 2013-11-23 MED ORDER — IBUPROFEN 600 MG PO TABS: 600.0000 mg | ORAL_TABLET | Freq: Four times a day (QID) | ORAL | Status: DC | PRN

## 2013-11-23 MED ORDER — OXYCODONE-ACETAMINOPHEN 5-325 MG PO TABS: 1.0000 | ORAL_TABLET | ORAL | Status: DC | PRN

## 2013-11-23 MED ORDER — METHOCARBAMOL 500 MG PO TABS
1000.0000 mg | ORAL_TABLET | Freq: Once | ORAL | Status: AC
  Administered 2013-11-23: 1000 mg via ORAL
  Filled 2013-11-23: qty 2

## 2013-11-23 MED ORDER — KETOROLAC TROMETHAMINE 60 MG/2ML IM SOLN
60.0000 mg | Freq: Once | INTRAMUSCULAR | Status: AC
  Administered 2013-11-23: 60 mg via INTRAMUSCULAR
  Filled 2013-11-23: qty 2

## 2013-11-23 MED ORDER — METHOCARBAMOL 500 MG PO TABS: 1000.0000 mg | ORAL_TABLET | Freq: Two times a day (BID) | ORAL | Status: DC | PRN

## 2013-11-23 MED ORDER — OXYCODONE-ACETAMINOPHEN 5-325 MG PO TABS
1.0000 | ORAL_TABLET | Freq: Once | ORAL | Status: AC
  Administered 2013-11-23: 1 via ORAL
  Filled 2013-11-23: qty 1

## 2013-11-23 NOTE — Discharge Instructions (Signed)

## 2013-11-23 NOTE — ED Notes (Signed)
Pt arrived to the ED with a complaint of lower back pain.  Pt had an accident at work. Pt slipped on some spilt liquid and did the splits and sprained his back.  Pt is experiencing back spasms since.  Pt had such bad back pain earlier this am that he was unable to get up from the floor.

## 2013-11-23 NOTE — ED Provider Notes (Signed)
CSN: [id number]     Arrival date & time 11/23/13  0430 History   First MD Initiated Contact with Patient 11/23/13 [phone]     Chief Complaint  Patient presents with  . Back Pain     (Consider location/radiation/quality/duration/timing/severity/associated sxs/prior Treatment) HPI Patient presents with 2 days of low back pain with spasms. He states it started when he slipped and fell on the floor while at work. States he did not strike his back but caught himself. He began having right-sided greater than left-sided low back spasms. He denied any urinary or bowel incontinence. No fever or chills. He's had no numbness or weakness in his legs. States the pain is exacerbated by movement. Past Medical History  Diagnosis Date  . Hypertension   . Hyperlipidemia   . Tobacco use    No past surgical history on file. Family History  Problem Relation Age of Onset  . Hypertension Mother    History  Substance Use Topics  . Smoking status: Current Every Day Smoker  . Smokeless tobacco: Never Used  . Alcohol Use: No    Review of Systems  Constitutional: Negative for fever and chills.  Respiratory: Negative for shortness of breath.   Cardiovascular: Negative for chest pain.  Gastrointestinal: Negative for nausea, vomiting and abdominal pain.  Genitourinary: Negative for difficulty urinating.  Musculoskeletal: Positive for back pain and myalgias. Negative for neck pain and neck stiffness.  Skin: Negative for rash and wound.  Neurological: Negative for dizziness, weakness, light-headedness, numbness and headaches.  All other systems reviewed and are negative.     Allergies  Review of patient's allergies indicates no known allergies.  Home Medications   Prior to Admission medications   Medication Sig Start Date End Date Taking? Authorizing Elleana Stillson  amLODipine (NORVASC) 10 MG tablet Take 1 tablet (10 mg total) by mouth daily. 09/09/12   Sondra Bargesyan M Dunn, PA-C  aspirin 81 MG tablet Take 81 mg by  mouth daily.    Historical Beckey Polkowski, MD  atorvastatin (LIPITOR) 10 MG tablet Take 1 tablet (10 mg total) by mouth daily. 09/09/12   Sondra Bargesyan M Dunn, PA-C  hydrochlorothiazide (HYDRODIURIL) 12.5 MG tablet Take 1 tablet (12.5 mg total) by mouth daily. 09/09/12 09/09/13  Ryan M Dunn, PA-C  lisinopril (PRINIVIL,ZESTRIL) 10 MG tablet Take 1 tablet (10 mg total) by mouth daily. 2 po q am, 1 po q pm 09/09/12   Sondra Bargesyan M Dunn, PA-C  Multiple Vitamin (MULTIVITAMIN) tablet Take 1 tablet by mouth daily.    Historical Jinnie Onley, MD   BP 206/112  Pulse 69  Temp(Src) 96.2 F (35.7 C) (Oral)  Resp 18  SpO2 100% Physical Exam  Nursing note and vitals reviewed. Constitutional: He is oriented to person, place, and time. He appears well-developed and well-nourished.  Patient appears uncomfortable  HENT:  Head: Normocephalic and atraumatic.  Mouth/Throat: Oropharynx is clear and moist.  Eyes: EOM are normal. Pupils are equal, round, and reactive to light.  Neck: Normal range of motion. Neck supple.  No posterior midline cervical tenderness to palpation.  Cardiovascular: Normal rate and regular rhythm.  Exam reveals no gallop and no friction rub.   No murmur heard. Pulmonary/Chest: Effort normal and breath sounds normal. No respiratory distress. He has no wheezes. He has no rales. He exhibits no tenderness.  Abdominal: Soft. Bowel sounds are normal. He exhibits no distension and no mass. There is no tenderness. There is no rebound and no guarding.  Musculoskeletal: Normal range of motion. He exhibits no edema  and no tenderness.  Tenderness to palpation in the right lumbar paraspinal muscles. Spasm appreciated. Bilateral negative straight-leg raise. Distal pulses intact.  Neurological: He is alert and oriented to person, place, and time.  5/5 motor in all extremities. Sensation is intact.  Skin: Skin is warm and dry. No rash noted. No erythema.  Psychiatric: He has a normal mood and affect. His behavior is normal.    ED  Course  Procedures (including critical care time) Labs Review Labs Reviewed - No data to display  Imaging Review No results found.   EKG Interpretation None      MDM   Final diagnoses:  None     Patient states his back spasms have improved. Patient continues to be neurologically stable. Blood pressures improved but still remains elevated. He states he is going home to take his hypertensive medication. He is advised to followup with his primary Dr. regarding his blood pressure. Return precautions given  Loren Raceravid Yelverton, MD 11/23/13 [phone]

## 2014-07-21 ENCOUNTER — Telehealth: Payer: Self-pay

## 2014-07-27 ENCOUNTER — Encounter: Payer: Self-pay | Admitting: Family Medicine

## 2014-07-27 ENCOUNTER — Ambulatory Visit: Payer: Self-pay | Admitting: Emergency Medicine

## 2014-07-27 ENCOUNTER — Encounter: Payer: BLUE CROSS/BLUE SHIELD | Admitting: Family Medicine

## 2014-07-27 NOTE — Progress Notes (Signed)
  This encounter was created in error - please disregard.  Pt rescheduled; he had to leave before being seen.

## 2014-08-05 ENCOUNTER — Other Ambulatory Visit: Payer: Self-pay | Admitting: Physician Assistant

## 2014-08-06 ENCOUNTER — Other Ambulatory Visit: Payer: Self-pay | Admitting: Physician Assistant

## 2014-12-09 ENCOUNTER — Other Ambulatory Visit: Payer: Self-pay | Admitting: Physician Assistant

## 2015-02-18 DIAGNOSIS — E559 Vitamin D deficiency, unspecified: Secondary | ICD-10-CM | POA: Insufficient documentation

## 2015-04-11 NOTE — Telephone Encounter (Signed)
ERROR

## 2015-09-29 ENCOUNTER — Emergency Department (HOSPITAL_BASED_OUTPATIENT_CLINIC_OR_DEPARTMENT_OTHER)
Admission: EM | Admit: 2015-09-29 | Discharge: 2015-09-29 | Disposition: A | Discharge status: Home / Self Care | Payer: BLUE CROSS/BLUE SHIELD | Attending: Emergency Medicine | Admitting: Emergency Medicine

## 2015-09-29 ENCOUNTER — Telehealth: Payer: Self-pay | Admitting: *Deleted

## 2015-09-29 ENCOUNTER — Ambulatory Visit (INDEPENDENT_AMBULATORY_CARE_PROVIDER_SITE_OTHER): Payer: BLUE CROSS/BLUE SHIELD | Admitting: Physician Assistant

## 2015-09-29 ENCOUNTER — Encounter (HOSPITAL_BASED_OUTPATIENT_CLINIC_OR_DEPARTMENT_OTHER): Payer: Self-pay | Admitting: *Deleted

## 2015-09-29 VITALS — BP 142/102 | HR 103 | Temp 98.3°F | Resp 20 | Ht 70.75 in | Wt 252.6 lb

## 2015-09-29 DIAGNOSIS — Z87891 Personal history of nicotine dependence: Secondary | ICD-10-CM | POA: Diagnosis not present

## 2015-09-29 DIAGNOSIS — H5789 Other specified disorders of eye and adnexa: Secondary | ICD-10-CM

## 2015-09-29 DIAGNOSIS — H5712 Ocular pain, left eye: Secondary | ICD-10-CM | POA: Insufficient documentation

## 2015-09-29 DIAGNOSIS — Z5321 Procedure and treatment not carried out due to patient leaving prior to being seen by health care provider: Secondary | ICD-10-CM | POA: Insufficient documentation

## 2015-09-29 DIAGNOSIS — I10 Essential (primary) hypertension: Secondary | ICD-10-CM | POA: Diagnosis not present

## 2015-09-29 DIAGNOSIS — E785 Hyperlipidemia, unspecified: Secondary | ICD-10-CM | POA: Diagnosis not present

## 2015-09-29 DIAGNOSIS — H578 Other specified disorders of eye and adnexa: Secondary | ICD-10-CM | POA: Diagnosis not present

## 2015-09-29 MED ORDER — FLUORESCEIN SODIUM 1 MG OP STRP: 1.0000 | ORAL_STRIP | Freq: Once | OPHTHALMIC | Status: DC

## 2015-09-29 MED ORDER — TETRACAINE HCL 0.5 % OP SOLN: 2.0000 [drp] | Freq: Once | OPHTHALMIC | Status: DC

## 2015-09-29 NOTE — Telephone Encounter (Signed)
Pt called to state that his eye is now hurting really bad.  Per Maralyn SagoSarah pt needs to use eye drops, use gel during the day and ointment at night.  Pt was not satisfied and will head over the ER.

## 2015-09-29 NOTE — ED Notes (Addendum)
Pt c/o left eye pain ? Foreign body, seen at [facility]D today for same , given eye drops told no foreign body , but states "im not satisfied  i know theres something in it"

## 2015-09-29 NOTE — ED Notes (Signed)
In assessing the patient and the pateint states that his eye feels fine now. He reports that he no longer wants to be seen and if he continues to have problems he will come back tomorrow

## 2015-09-29 NOTE — Patient Instructions (Addendum)
For persistent eye irritation, take OTC ibuprofen and use on OTC eye drops for dry eyes (Systane is my favorite, Natural Tears is another good one).    IF you received an x-ray today, you will receive an invoice from [facility]. Please contact [facility] at [phone] with questions or concerns regarding your invoice.   IF you received labwork today, you will receive an invoice from [facility]. Please contact [facility] at [phone] with questions or concerns regarding your invoice.   Our billing staff will not be able to assist you with questions regarding bills from these companies.  You will be contacted with the lab results as soon as they are available. The fastest way to get your results is to activate your My Chart account. Instructions are located on the last page of this paperwork. If you have not heard from us regarding the results in 2 weeks, please contact this office.

## 2015-09-29 NOTE — Progress Notes (Signed)
Patient ID: Paul Yang, male     DOB: 12/02/1960, 55 y.o.    MRN: [id number]  PCP: Paul Sago, MD  Chief Complaint  Patient presents with  . Foreign Body in Eye    LT Eye Burning, itching  x 1 1/2 hr ago    Subjective:    HPI  Presents for evaluation of possible FB in the LEFT eye.  Today he was driving and felt like something blew into his eye. He's been to a fire department to irrigate it, but without benefit.  No change in his vision. The eye feels irritated, and he's having increased tearing. No other drainage. He reports that he can feel something under the outer portion of the upper lid.   Prior to Admission medications   Medication Sig Start Date End Date Taking? Authorizing Yang  amLODipine (NORVASC) 10 MG tablet TAKE 1 TABLET BY MOUTH DAILY "OV NEEDED FOR ADDITIONAL REFILLS" 08/08/14  Yes Paul Dansereau, PA-C  aspirin 81 MG tablet Take 81 mg by mouth daily.   Yes Paul Provider, MD  atorvastatin (LIPITOR) 10 MG tablet TAKE 1 TABLET BY MOUTH DAILY.  "NO MORE REFILLS WITHOUT OV" 12/10/14  Yes Paul Riddle, PA-C  lisinopril (PRINIVIL,ZESTRIL) 10 MG tablet TAKE 2 TABLETS BY MOUTH EVERY MORNING, AND 1 TABLET EVERY EVENING.  "OV NEEDED FOR ADDITIONAL REFILLS" 08/08/14  Yes Paul Medders, PA-C  hydrochlorothiazide (HYDRODIURIL) 12.5 MG tablet Take 1 tablet (12.5 mg total) by mouth daily. Patient not taking: Reported on 07/27/2014 09/09/12 09/09/13  Paul Barges, PA-C     No Known Allergies   Patient Active Problem List   Diagnosis Date Noted  . Avitaminosis D 02/18/2015  . Obesity (BMI 30.0-34.9) 06/17/2012  . Hyperlipidemia LDL goal < 100 06/17/2012  . Hypertension      Family History  Problem Relation Age of Onset  . Hypertension Mother      Social History   Social History  . Marital Status: Married    Spouse Name: Paul Yang  . Number of Children: 0  . Years of Education: N/A   Occupational History  . Pilot    Social History Main  Topics  . Smoking status: Former Games developer  . Smokeless tobacco: Never Used  . Alcohol Use: No  . Drug Use: No  . Sexual Activity:    Partners: Female   Other Topics Concern  . Not on file   Social History Narrative   Lives with his wife and her two sons.        Review of Systems  Constitutional: Negative for fever, chills and fatigue.  HENT: Negative for congestion.   Eyes: Positive for pain. Negative for photophobia, discharge, redness, itching and visual disturbance.  Neurological: Negative for dizziness, weakness and headaches.         Objective:  Physical Exam  Constitutional: He is oriented to person, place, and time. He appears well-developed and well-nourished.  BP 142/102 mmHg  Pulse 103  Temp(Src) 98.3 F (36.8 C) (Oral)  Resp 20  Ht 5' 10.75" (1.797 m)  Wt 252 lb 9.6 oz (114.579 kg)  BMI 35.48 kg/m2  SpO2 98%   HENT:  Head: Normocephalic and atraumatic.  Eyes: Conjunctivae, EOM and lids are normal. Pupils are equal, round, and reactive to light. Right eye exhibits no chemosis, no discharge, no exudate and no hordeolum. Left eye exhibits no chemosis, no discharge, no exudate and no hordeolum. No scleral icterus.  Visual Acuity in Right Eye -  Without correction: 20/20 Visual Acuity in Left Eye - Without correction: 20/30    Visual Acuity in Both Eyes -Without correction: 20/20  Local anesthesia with proparacaine. Fluorescein. No areas of increased uptake. No FB seen. Lid everted and swept. Irrigated with saline.        Neck: Normal range of motion. Neck supple. No thyromegaly present.  Pulmonary/Chest: Effort normal.  Lymphadenopathy:    He has no cervical adenopathy.  Neurological: He is alert and oriented to person, place, and time.  Skin: Skin is warm and dry.  Psychiatric: He has a normal mood and affect. His speech is normal and behavior is normal.             Assessment & Plan:  1. Irritation of left eye No FB found. No evidence of a  corneal abrasion. Advised that there may be some persistent irritation from a FB that is no longer present. Advised eye hydrating drops (Systane or Natural tears) and OTC ibuprofen. He may also irrigate the eye with saline if needed. If not improving, or if worsening, RTC or seek eye specialist evaluation.   Paul Brashelle S. Kyel Purk, PA-C Physician Assistant-Certified [facility]

## 2016-02-20 ENCOUNTER — Ambulatory Visit
Admission: RE | Admit: 2016-02-20 | Discharge: 2016-02-20 | Disposition: A | Discharge status: Home / Self Care | Payer: BLUE CROSS/BLUE SHIELD | Source: Ambulatory Visit | Attending: Physician Assistant | Admitting: Physician Assistant

## 2016-02-20 ENCOUNTER — Other Ambulatory Visit: Payer: Self-pay | Admitting: Physician Assistant

## 2016-02-20 DIAGNOSIS — M25571 Pain in right ankle and joints of right foot: Secondary | ICD-10-CM

## 2016-03-25 DIAGNOSIS — I25110 Atherosclerotic heart disease of native coronary artery with unstable angina pectoris: Secondary | ICD-10-CM

## 2016-03-25 DIAGNOSIS — I214 Non-ST elevation (NSTEMI) myocardial infarction: Secondary | ICD-10-CM | POA: Insufficient documentation

## 2016-03-25 HISTORY — DX: Atherosclerotic heart disease of native coronary artery with unstable angina pectoris: I25.110

## 2016-03-25 HISTORY — DX: Non-ST elevation (NSTEMI) myocardial infarction: I21.4

## 2016-03-27 HISTORY — PX: CORONARY STENT PLACEMENT: SHX1402

## 2016-03-27 HISTORY — PX: OTHER SURGICAL HISTORY: SHX169

## 2016-03-27 HISTORY — PX: TRANSTHORACIC ECHOCARDIOGRAM: SHX275

## 2016-04-10 ENCOUNTER — Telehealth: Payer: Self-pay | Admitting: Cardiology

## 2016-04-10 NOTE — Telephone Encounter (Signed)
[facility] for appointment on 04/18/16 with Dr Herbie BaltimoreHarding.  [facility] for Dr Elissa HeftyHarding's schedule on 04/18/16. lp

## 2016-04-18 ENCOUNTER — Ambulatory Visit (INDEPENDENT_AMBULATORY_CARE_PROVIDER_SITE_OTHER): Payer: BLUE CROSS/BLUE SHIELD | Admitting: Cardiology

## 2016-04-18 ENCOUNTER — Encounter: Payer: Self-pay | Admitting: Cardiology

## 2016-04-18 VITALS — BP 131/88 | HR 57 | Ht 71.0 in | Wt 242.4 lb

## 2016-04-18 DIAGNOSIS — I25110 Atherosclerotic heart disease of native coronary artery with unstable angina pectoris: Secondary | ICD-10-CM

## 2016-04-18 DIAGNOSIS — I214 Non-ST elevation (NSTEMI) myocardial infarction: Secondary | ICD-10-CM

## 2016-04-18 DIAGNOSIS — E785 Hyperlipidemia, unspecified: Secondary | ICD-10-CM

## 2016-04-18 DIAGNOSIS — I10 Essential (primary) hypertension: Secondary | ICD-10-CM

## 2016-04-18 NOTE — Patient Instructions (Addendum)
NO CHANGE WITH CURRENT MEDICATIONS    Your physician recommends that you schedule a follow-up appointment in:FEB 2018 WITH DR HARDING.   If you need a refill on your cardiac medications before your next appointment, please call your pharmacy.

## 2016-04-18 NOTE — Progress Notes (Signed)
PCP: Paul IraniSPENCER,[name], PA-Yang  Clinic Note: Chief Complaint  Patient presents with  . New Patient (Initial Visit)    Pt states no Sx. Establishing care.     HPI: Paul Yang is a 55 y.o. male with a PMH notable for recent MI-CAD with prior h/o HTN & HLD who presents today for reported history of coronary disease - 100% rPDA - POBA of mrPDA & & DES PCI of dRCA 03/27/16 with moderate LAD &Cx disease.  Paul Yang was discharged after his non-STEMI with PCI to the RCA with plans to follow-up with a Myoview to investigate the LAD. It is now been scheduled for November 24 and then he will see a cardiologist in [place] to follow-up the results of the month Myoview. Once he has this episode completed, he then will keep maintain his follow-up done here.  Recent Hospitalizations: on October 22 he developed substernal chest pain described as indigestion sensation in the need to belch. No dyspnea or diaphoresis. Not obviously worsened by position change or exertion. Was noted to have severely elevated blood pressure of greater than 200 mmHg it did not resolve with nitroglycerin. He was therefore taken to the ER and blood pressure had gone down to 160s. EKG showed significant ST segment changes (borderline inferior ST elevation with positive troponin.  He taken a cardiac Cath Lab on October 24 and underwent intervention on his RPDA in RCA. As the results of his MI, he is grounded for 90 days post MI. In a preliminary reading of the requirements for returning to duty, it appears they ([employer]) require a LHC within 3 mo of discharge.  -- Amlodipine increased to 10 mg discharge.  Smoking cessation counseling -- Plan was [place] complete [place] in [place] where the initial event occurred and then [place]  Studies Reviewed:    LHC 03/27/16:   100% occlusion of the mid right posterior descending artery, treated with POBA.   Severe stenosis (80%) of the distal RCA treated  with 1 DES.   Stenosis (70%) of the mid LAD, and moderate stenosis (60%) of the mid LCx. -->Unclear if LAD dz will require PCI.  Plan was to check outpatient stress test to determine if PCI to the LAD was warranted.  (This was tentatively scheduled for November 24)   TTE results: EF of 50-55%, with low normal global systolic left ventricular function. Grade 1 diastolic dysfunction. Mild concentric LVH.  Interval History: Paul Yang presents today actually feeling quite well. He has not had any resting or exertional chest pain or discomfort since. He didn't notice chest discomfort per se as his angina, he really describes it as a indigestion gas type sensation that pretty much went away after doing a GI cocktail. He was surprised to find out that his troponins were positive and that he had two-vessel disease on cath. He has not had any further symptoms since the cath. He has not had any PND, orthopnea or edema.  He has not yet gotten back into doing any routine exercise, and is questioning when he can start activity. No resting or exertional dyspnea.  No palpitations, lightheadedness, dizziness, weakness or syncope/near syncope. No TIA/amaurosis fugax symptoms. No melena, hematochezia, hematuria, or epstaxis. No claudication.  ROS: A comprehensive was performed. Review of Systems  Constitutional: Negative for fever and weight loss.  HENT: Negative for congestion and nosebleeds.   Respiratory: Negative for cough, shortness of breath and wheezing.   Cardiovascular: Negative.  Negative for  chest pain and claudication.  Gastrointestinal: Negative for blood in stool and constipation.  Genitourinary: Negative for hematuria.  Musculoskeletal: Negative for joint pain.  Skin: Negative.   Neurological: Negative for dizziness and seizures.  Endo/Heme/Allergies: Negative for environmental allergies.  Psychiatric/Behavioral: Negative for depression and memory loss. The patient is not nervous/anxious and  does not have insomnia.   All other systems reviewed and are negative.   Past Medical History:  Diagnosis Date  . 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  Severe stenosis (80%) of the distal RCA treated with 1 DES.  Stenosis (70%) of the mid LAD, and moderate stenosis (60%) of the mid LCx. -->Unclear if LAD dz will require PCI.  Plan was to check outpatient stress test to determine if PCI to the LAD was warranted.  (This was tentatively scheduled for November 15)   . Hyperlipidemia   . Hypertension   . Non-STEMI (non-ST elevated myocardial infarction) [facility]) 03/25/2016   [facility] in [place] (Dr. Doffing Callas): ? if presentation was STEMI or NSTEMI: 100% rPDA (POBA) & 80% dRCA (DES PCI), residual ~70% LAD & 60% Cx.  . Tobacco use     Past Surgical History:  Procedure Laterality Date  . CORONARY STENT PLACEMENT  03/27/2016   [facility], [place], [place]:: POBA of mRPDA, DES PCI of dRCA (80%)  . Left Heart Catheterization  03/27/2016   [facility], [place], [place]: Unable to complete via radial access, switched to right groin. 100% rPDA, 80% dRCA, 70% mLAD, 60% mCx --> POBA/PCI PDA/dRCA  . TRANSTHORACIC ECHOCARDIOGRAM  03/27/2016   [facility], [place], [place]:EF of 50-55%, with low normal global systolic left ventricular function. Grade 1 diastolic dysfunction. Mild concentric LVH..     Current Meds  Medication Sig  . amLODipine (NORVASC) 10 MG tablet TAKE 1 TABLET BY MOUTH DAILY "OV NEEDED FOR ADDITIONAL REFILLS"  . aspirin 81 MG tablet Take 81 mg by mouth daily.  Marland Kitchen atorvastatin (LIPITOR) 80 MG tablet Take 80 mg by mouth daily.  . clopidogrel (PLAVIX) 75 MG tablet Take by mouth.  . hydrochlorothiazide (HYDRODIURIL) 12.5 MG tablet Take 1 tablet (12.5 mg total) by mouth daily.  Marland Kitchen lisinopril  (PRINIVIL,ZESTRIL) 40 MG tablet Take 40 mg by mouth daily.  . metoprolol tartrate (LOPRESSOR) 25 MG tablet Take by mouth.  . nitroGLYCERIN (NITROSTAT) 0.4 MG SL tablet Place under the tongue.  . [DISCONTINUED] atorvastatin (LIPITOR) 10 MG tablet TAKE 1 TABLET BY MOUTH DAILY.  "NO MORE REFILLS WITHOUT OV"  . [DISCONTINUED] lisinopril (PRINIVIL,ZESTRIL) 10 MG tablet TAKE 2 TABLETS BY MOUTH EVERY MORNING, AND 1 TABLET EVERY EVENING.  "OV NEEDED FOR ADDITIONAL REFILLS"    No Known Allergies   Social History   Social History  . Marital status: Married    Spouse name: Paul Yang  . Number of children: 0  . Years of education: N/A   Occupational History  . Aeronautical engineer for [employer]   Social History Main Topics  . Smoking status: Former Games developer  . Smokeless tobacco: Never Used  . Alcohol use No  . Drug use: No  . Sexual activity: Yes    Partners: Female   Other Topics Concern  . None   Social History Narrative   Lives with his wife and her two sons.    Family History  Problem  Relation Age of Onset  . Hypertension Mother   . Heart attack Father 50    Wt Readings from Last 3 Encounters:  04/18/16 110 kg (242 lb 6.4 oz)  09/29/15 109.8 kg (242 lb)  09/29/15 114.6 kg (252 lb 9.6 oz)    PHYSICAL EXAM BP 131/88   Pulse (!) 57   Ht 5\' 11"  (1.803 m)   Wt 110 kg (242 lb 6.4 oz)   BMI 33.81 kg/m  General appearance: alert, cooperative, appears stated age, no distress and mildly obese; well groomed. Healthy appearing  Neck: no adenopathy, no carotid bruit and no JVD Lungs: clear to auscultation bilaterally, normal percussion bilaterally and non-labored Heart: regular rate and rhythm, S1& S2 normal, no murmur, click, rub or gallop; non-displaced PMI Abdomen: soft, non-tender; bowel sounds normal; no masses,  no organomegaly; No HJR Extremities: extremities normal, atraumatic, no cyanosis, or edema Pulses: 2+ and symmetric;  Skin: mild varicosities, but no  venous stasis changes  Neurologic: Mental status: Alert, oriented, thought content appropriate; Cranial nerves: normal (II-XII grossly intact)   Adult ECG Report  Rate: 57 ;  Rhythm: sinus bradycardia and Inferior T-wave inversions with Q waves consistent with evolving changes of prior inferior STEMI. 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 His EKG would suggest that this physiologically was consistent with an occluded vessel and therefore possibly STEMI pathophysiology despite having been chest pain-free. Plan for now is Myoview Stress Test on the 24th to evaluate the lesion in the LAD.  He is currently on aspirin and Plavix along with beta blocker, amlodipine, ACE inhibitor and statin.  No further angina. 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 Regardless of what is found on this current finding, he will still need another stress test 90 days after his MI if nothing is done on the LAD, however if this intervention of the LAD it would be 120 days post PCI.  I will see him back in January timeframe, at that point we will seen the results of the stress test and any potential LAD PCI. - Hopefully we can get his cath films.      Relevant Medications   metoprolol tartrate (LOPRESSOR) 25 MG tablet   nitroGLYCERIN (NITROSTAT) 0.4 MG SL tablet   atorvastatin (LIPITOR) 80 MG tablet   lisinopril (PRINIVIL,ZESTRIL) 40 MG tablet   Other Relevant Orders   EKG 12-Lead   Non-STEMI (non-ST elevated  myocardial infarction) (HCC) (Chronic)    I think he would benefit from cardiac rehabilitation in order to discuss dietary changes and exercise. 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  We will also need to follow guidelines from [employer]      Relevant Medications   metoprolol tartrate (LOPRESSOR) 25 MG tablet   nitroGLYCERIN (NITROSTAT) 0.4 MG SL tablet   atorvastatin (LIPITOR) 80 MG tablet   lisinopril (PRINIVIL,ZESTRIL) 40 MG tablet   Other Relevant Orders  EKG 12-Lead   Essential hypertension (Chronic)    Upper normal pressures today with a diastolic pressure of 88. Cannot increased beta blocker dose any further. He is on equivalent of 40 mg of lisinopril as well as 12.5 HCTZ. Next potential adjustment will be to increase HCTZ to 25.      Relevant Medications   metoprolol tartrate (LOPRESSOR) 25 MG tablet   nitroGLYCERIN (NITROSTAT) 0.4 MG SL tablet   atorvastatin (LIPITOR) 80 MG tablet   lisinopril (PRINIVIL,ZESTRIL) 40 MG tablet   Other Relevant Orders   EKG 12-Lead   Hyperlipidemia with target LDL less than 70 (Chronic)    On atorvastatin 80 mg. We will recheck lipids once I see him back in February      Relevant Medications   metoprolol tartrate (LOPRESSOR) 25 MG tablet   nitroGLYCERIN (NITROSTAT) 0.4 MG SL tablet   atorvastatin (LIPITOR) 80 MG tablet   lisinopril (PRINIVIL,ZESTRIL) 40 MG tablet   Other Relevant Orders   EKG 12-Lead      Current medicines are reviewed at length with the patient today. (+/- concerns) n/a  The following changes have been made:  Will consider [facility] referral following LAD evaluation. -- Needs instruction on diet & post-MI exercise regimen.  Patient Instructions  NO CHANGE WITH CURRENT MEDICATIONS    Your physician recommends that you schedule a follow-up appointment in:FEB 2018 WITH DR Alan Drummer.   If you need a refill on your cardiac  medications before your next appointment, please call your pharmacy.     Studies Ordered:   Orders Placed This Encounter  Procedures  . EKG 12-Lead      Bryan Lemmaavid Rokhaya Quinn, M.D., M.S. Interventional Cardiologist   Pager # [phone] Phone # [phone] [address] [place], [place] [address]

## 2016-04-19 ENCOUNTER — Encounter: Payer: Self-pay | Admitting: Cardiology

## 2016-04-19 NOTE — Assessment & Plan Note (Addendum)
On atorvastatin 80 mg. We will recheck lipids once I see him back in February

## 2016-04-19 NOTE — Assessment & Plan Note (Signed)
Upper normal pressures today with a diastolic pressure of 88. Cannot increased beta blocker dose any further. He is on equivalent of 40 mg of lisinopril as well as 12.5 HCTZ. Next potential adjustment will be to increase HCTZ to 25.

## 2016-04-19 NOTE — Assessment & Plan Note (Signed)
I think he would benefit from cardiac rehabilitation in order to discuss dietary changes and exercise. 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  We will also need to follow guidelines from [employer]

## 2016-04-19 NOTE — Assessment & Plan Note (Signed)
Apparently successful PCI to the distal RCA and PDA. His EKG would suggest that this physiologically was consistent with an occluded vessel and therefore possibly STEMI pathophysiology despite having been chest pain-free. Plan for now is Myoview Stress Test on the 24th to evaluate the lesion in the LAD.  He is currently on aspirin and Plavix along with beta blocker, amlodipine, ACE inhibitor and statin.  No further angina. 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 Regardless of what is found on this current finding, he will still need another stress test 90 days after his MI if nothing is done on the LAD, however if this intervention of the LAD it would be 120 days post PCI.  I will see him back in January timeframe, at that point we will seen the results of the stress test and any potential LAD PCI. - Hopefully we can get his cath films.

## 2016-05-17 ENCOUNTER — Telehealth: Payer: Self-pay | Admitting: Cardiology

## 2016-05-17 NOTE — Telephone Encounter (Signed)
05/17/2016 [facility] Dr. Herbie BaltimoreHarding on 07/09/2016. Gave packet to [employer] [facility]

## 2016-07-09 ENCOUNTER — Ambulatory Visit: Payer: BLUE CROSS/BLUE SHIELD | Admitting: Cardiology

## 2018-03-15 IMAGING — CR DG ANKLE 2V *R*
2 series · 2 of 2 positions shown · non-contrast
Comparison: None.

CLINICAL DATA: Lateral left ankle pain for many years, some
stiffness in the ankle in the morning. No acute trauma

EXAM:
RIGHT ANKLE - 2 VIEW

[x ankle ap right]
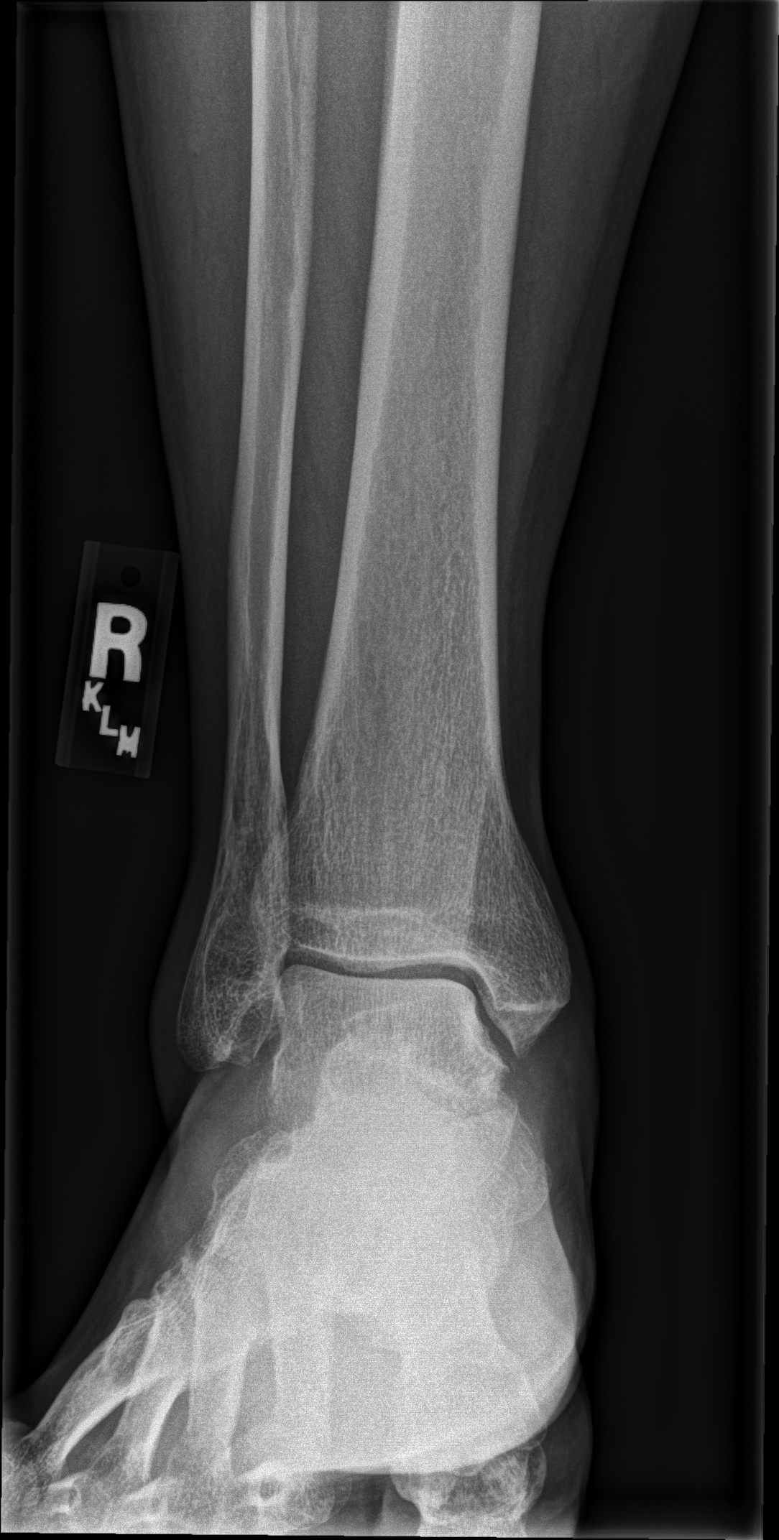

[x ankle lat right]
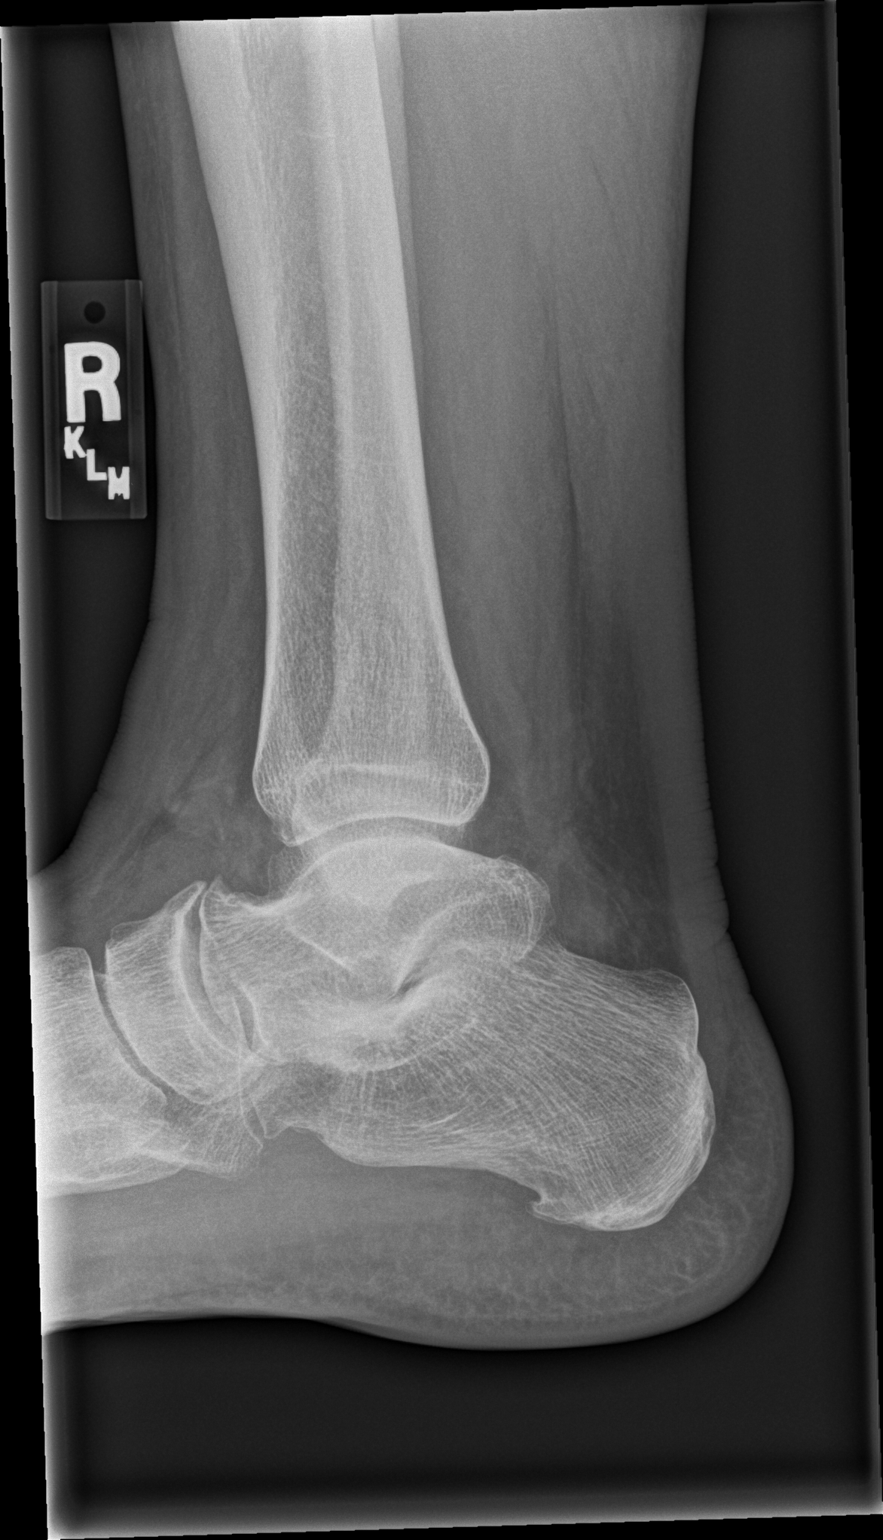

[2 of 2 positions shown; findings below may reference images not displayed]

FINDINGS: The ankle joint appears normal. No fracture is seen. Alignment is
normal. There is degenerative change of the midfoot particularly at
the talar-navicula articulation. A small plantar calcaneal
degenerative spur is present.
IMPRESSION: Degenerative change in the mid and hindfoot.  No acute abnormality.

## 2019-09-19 ENCOUNTER — Encounter (HOSPITAL_BASED_OUTPATIENT_CLINIC_OR_DEPARTMENT_OTHER): Payer: Self-pay | Admitting: *Deleted

## 2019-09-19 ENCOUNTER — Other Ambulatory Visit: Payer: Self-pay

## 2019-09-19 ENCOUNTER — Emergency Department (HOSPITAL_BASED_OUTPATIENT_CLINIC_OR_DEPARTMENT_OTHER)
Admission: EM | Admit: 2019-09-19 | Discharge: 2019-09-19 | Disposition: A | Discharge status: Home / Self Care | Payer: BC Managed Care – PPO | Attending: Emergency Medicine | Admitting: Emergency Medicine

## 2019-09-19 DIAGNOSIS — Z79899 Other long term (current) drug therapy: Secondary | ICD-10-CM | POA: Diagnosis not present

## 2019-09-19 DIAGNOSIS — Z955 Presence of coronary angioplasty implant and graft: Secondary | ICD-10-CM | POA: Insufficient documentation

## 2019-09-19 DIAGNOSIS — Z7902 Long term (current) use of antithrombotics/antiplatelets: Secondary | ICD-10-CM | POA: Insufficient documentation

## 2019-09-19 DIAGNOSIS — I252 Old myocardial infarction: Secondary | ICD-10-CM | POA: Diagnosis not present

## 2019-09-19 DIAGNOSIS — Z87891 Personal history of nicotine dependence: Secondary | ICD-10-CM | POA: Diagnosis not present

## 2019-09-19 DIAGNOSIS — I2510 Atherosclerotic heart disease of native coronary artery without angina pectoris: Secondary | ICD-10-CM | POA: Diagnosis not present

## 2019-09-19 DIAGNOSIS — M545 Low back pain: Secondary | ICD-10-CM | POA: Diagnosis present

## 2019-09-19 DIAGNOSIS — I10 Essential (primary) hypertension: Secondary | ICD-10-CM | POA: Insufficient documentation

## 2019-09-19 DIAGNOSIS — M5431 Sciatica, right side: Secondary | ICD-10-CM | POA: Diagnosis not present

## 2019-09-19 DIAGNOSIS — Z7982 Long term (current) use of aspirin: Secondary | ICD-10-CM | POA: Diagnosis not present

## 2019-09-19 MED ORDER — DEXAMETHASONE SODIUM PHOSPHATE 10 MG/ML IJ SOLN
10.0000 mg | Freq: Once | INTRAMUSCULAR | Status: DC
  Filled 2019-09-19: qty 1

## 2019-09-19 MED ORDER — PREDNISONE 20 MG PO TABS: 40.0000 mg | ORAL_TABLET | Freq: Every day | ORAL | 0 refills | Status: AC

## 2019-09-19 MED ORDER — DIAZEPAM 2 MG PO TABS
2.0000 mg | ORAL_TABLET | Freq: Once | ORAL | Status: AC
  Administered 2019-09-19: 2 mg via ORAL
  Filled 2019-09-19: qty 1

## 2019-09-19 MED ORDER — OXYCODONE-ACETAMINOPHEN 5-325 MG PO TABS
1.0000 | ORAL_TABLET | Freq: Once | ORAL | Status: AC
  Administered 2019-09-19: 22:00:00 1 via ORAL
  Filled 2019-09-19: qty 1

## 2019-09-19 MED ORDER — KETOROLAC TROMETHAMINE 60 MG/2ML IM SOLN
30.0000 mg | Freq: Once | INTRAMUSCULAR | Status: AC
  Administered 2019-09-19: 30 mg via INTRAMUSCULAR
  Filled 2019-09-19: qty 2

## 2019-09-19 NOTE — ED Triage Notes (Signed)
Pt reports lower right sided back pain x 2 days. Reports hx of same with previous injury. Denies recent injury.

## 2019-09-19 NOTE — ED Notes (Signed)
ED Provider at bedside. 

## 2019-09-19 NOTE — Discharge Instructions (Signed)
Please take steroids as prescribed.  Recommend recheck with primary doctor next week.  Return to ER if you develop numbness, weakness, fever, bladder or bowel incontinence, or other new concerning symptom.  Additionally recommend Tylenol as needed.

## 2019-09-19 NOTE — ED Provider Notes (Signed)
[facility] EMERGENCY DEPARTMENT Provider Note   CSN: [id number] Arrival date & time: 09/19/19  2042     History Chief Complaint  Patient presents with  . Back Pain    Paul Yang is a 59 y.o. male.  Presents to the emergency room with chief complaint of low back pain.  Reports that he has had similar episodes, seen in outside ER and [facility].  Couple days ago he had sudden onset of right lower back pain, radiating down right leg.  Comes and goes in waves.  History of CAD, denies any other chronic medical conditions.  HPI     Past Medical History:  Diagnosis Date  . 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  Severe stenosis (80%) of the distal RCA treated with 1 DES.  Stenosis (70%) of the mid LAD, and moderate stenosis (60%) of the mid LCx. -->Unclear if LAD dz will require PCI.  Plan was to check outpatient stress test to determine if PCI to the LAD was warranted.  (This was tentatively scheduled for November 15)   . Hyperlipidemia   . Hypertension   . Non-STEMI (non-ST elevated myocardial infarction) [facility]) 03/25/2016   [facility] in [place] (Dr. [place] [facility]): ? if presentation was STEMI or NSTEMI: 100% rPDA (POBA) & 80% dRCA (DES PCI), residual ~70% LAD & 60% Cx.  . Tobacco use     Patient Active Problem List   Diagnosis Date Noted  . 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 Non-STEMI (non-ST elevated myocardial infarction) (HCC) 03/25/2016  . Avitaminosis D 02/18/2015  . Obesity (BMI 30.0-34.9) 06/17/2012  . Hyperlipidemia with target LDL less than 70 06/17/2012  . Essential hypertension     Past Surgical History:  Procedure Laterality Date  . CORONARY STENT PLACEMENT  03/27/2016   [facility], [place], [place]:: POBA of  mRPDA, DES PCI of dRCA (80%)  . Left Heart Catheterization  03/27/2016   [facility], [place], [place]: Unable to complete via radial access, switched to right groin. 100% rPDA, 80% dRCA, 70% mLAD, 60% mCx --> POBA/PCI PDA/dRCA  . TRANSTHORACIC ECHOCARDIOGRAM  03/27/2016   [facility], [place], [place]:EF of 50-55%, with low normal global systolic left ventricular function. Grade 1 diastolic dysfunction. Mild concentric LVH..        Family History  Problem Relation Age of Onset  . Hypertension Mother   . Heart attack Father 48    Social History   Tobacco Use  . Smoking status: Former Games developer  . Smokeless tobacco: Never Used  Substance Use Topics  . Alcohol use: No    Alcohol/week: 0.0 standard drinks  . Drug use: No    Home Medications Prior to Admission medications   Medication Sig Start Date End Date Taking? Authorizing Provider  amLODipine (NORVASC) 10 MG tablet TAKE 1 TABLET BY MOUTH DAILY "OV NEEDED FOR ADDITIONAL REFILLS" 08/08/14   Porfirio Oar, PA  aspirin 81 MG tablet Take 81 mg by mouth daily.    [provider]  atorvastatin (LIPITOR) 80 MG tablet Take 80 mg by mouth daily.    [provider]  clopidogrel (PLAVIX) 75 MG tablet Take by mouth. 03/29/16   [provider]  hydrochlorothiazide (HYDRODIURIL) 12.5 MG tablet Take 1 tablet (12.5 mg total) by mouth daily. 09/09/12 04/19/19  Sondra Barges, PA-C  lisinopril (PRINIVIL,ZESTRIL) 40 MG tablet Take 40 mg by mouth daily.    [provider]  metoprolol tartrate (LOPRESSOR) 25 MG tablet Take by mouth. 03/28/16   [provider]  nitroGLYCERIN (NITROSTAT) 0.4 MG SL tablet Place under the tongue. 03/28/16   [provider]  predniSONE (DELTASONE) 20 MG tablet Take 2 tablets (40 mg total) by mouth daily with breakfast for 4 days. 09/19/19 09/23/19  Milagros Loll, MD    Allergies    Patient has no known allergies.  Review of Systems   Review of Systems    Constitutional: Negative for chills and fever.  HENT: Negative for ear pain and sore throat.   Eyes: Negative for pain and visual disturbance.  Respiratory: Negative for cough and shortness of breath.   Cardiovascular: Negative for chest pain and palpitations.  Gastrointestinal: Negative for abdominal pain and vomiting.  Genitourinary: Negative for dysuria and hematuria.  Musculoskeletal: Positive for back pain. Negative for arthralgias.  Skin: Negative for color change and rash.  Neurological: Negative for seizures and syncope.  All other systems reviewed and are negative.   Physical Exam Updated Vital Signs BP (!) 137/95 (BP Location: Right Arm)   Pulse 74   Temp 98.1 F (36.7 C) (Oral)   Resp 20   Ht 5\' 11"  (1.803 m)   Wt 111.6 kg   SpO2 98%   BMI 34.31 kg/m   Physical Exam Vitals and nursing note reviewed.  Constitutional:      Appearance: He is well-developed.  HENT:     Head: Normocephalic and atraumatic.  Eyes:     Conjunctiva/sclera: Conjunctivae normal.  Cardiovascular:     Rate and Rhythm: Normal rate and regular rhythm.     Heart sounds: No murmur.  Pulmonary:     Effort: Pulmonary effort is normal. No respiratory distress.     Breath sounds: Normal breath sounds.  Abdominal:     Palpations: Abdomen is soft.     Tenderness: There is no abdominal tenderness.  Musculoskeletal:     Cervical back: Neck supple.     Comments: No midline C, T, L-spine tenderness, no step-off or deformity noted, there is some tenderness over the left lower lumbar region  Skin:    General: Skin is warm and dry.     Capillary Refill: Capillary refill takes less than 2 seconds.  Neurological:     General: No focal deficit present.     Mental Status: He is alert and oriented to person, place, and time.     Comments: 5 out of 5 strength in bilateral lower extremities, sensation to light touch intact in lower extremities     ED Results / Procedures / Treatments   Labs (all  labs ordered are listed, but only abnormal results are displayed) Labs Reviewed - No data to display  EKG None  Radiology No results found.  Procedures Procedures (including critical care time)  Medications Ordered in ED Medications  diazepam (VALIUM) tablet 2 mg (2 mg Oral Given 09/19/19 2151)  oxyCODONE-acetaminophen (PERCOCET/ROXICET) 5-325 MG per tablet 1 tablet (1 tablet Oral Given 09/19/19 2151)  ketorolac (TORADOL) injection 30 mg (30 mg Intramuscular Given 09/19/19 2159)    ED Course  I have reviewed the triage vital signs and the nursing notes.  Pertinent labs & imaging results that were available during my care of the patient were reviewed by me and considered in my medical decision making (see chart for details).  MDM Rules/Calculators/A&P                      59 year old male presenting to ER with acute on chronic low back pain.  Pain radiating to right leg, suspect sciatica.  No red flag symptoms, normal neurologic exam.  No trauma or deformity on exam, do not feel patient needs emergent spine imaging today.  Provided symptomatic control, complete resolution of symptoms.  Will give Rx for steroids for symptomatic control.  Recommend PCP recheck.    After the discussed management above, the patient was determined to be safe for discharge.  The patient was in agreement with this plan and all questions regarding their care were answered.  ED return precautions were discussed and the patient will return to the ED with any significant worsening of condition.  Final Clinical Impression(s) / ED Diagnoses Final diagnoses:  Sciatica of right side    Rx / DC Orders ED Discharge Orders         Ordered    predniSONE (DELTASONE) 20 MG tablet  Daily with breakfast     09/19/19 2241           Lucrezia Starch, MD 09/20/19 1643

## 2019-09-19 NOTE — ED Notes (Signed)
Dr. Dykstra ED Provider at bedside. 

## 2022-12-02 ENCOUNTER — Emergency Department (HOSPITAL_BASED_OUTPATIENT_CLINIC_OR_DEPARTMENT_OTHER)
Admission: EM | Admit: 2022-12-02 | Discharge: 2022-12-02 | Disposition: A | Discharge status: Home / Self Care | Payer: BC Managed Care – PPO | Attending: Emergency Medicine | Admitting: Emergency Medicine

## 2022-12-02 ENCOUNTER — Encounter (HOSPITAL_BASED_OUTPATIENT_CLINIC_OR_DEPARTMENT_OTHER): Payer: Self-pay | Admitting: Emergency Medicine

## 2022-12-02 ENCOUNTER — Other Ambulatory Visit: Payer: Self-pay

## 2022-12-02 DIAGNOSIS — Z7982 Long term (current) use of aspirin: Secondary | ICD-10-CM | POA: Diagnosis not present

## 2022-12-02 DIAGNOSIS — M545 Low back pain, unspecified: Secondary | ICD-10-CM | POA: Diagnosis present

## 2022-12-02 HISTORY — DX: Unspecified atrial fibrillation: I48.91

## 2022-12-02 MED ORDER — DIAZEPAM 5 MG PO TABS
5.0000 mg | ORAL_TABLET | Freq: Once | ORAL | Status: AC
  Administered 2022-12-02: 5 mg via ORAL
  Filled 2022-12-02: qty 1

## 2022-12-02 MED ORDER — METHOCARBAMOL 500 MG PO TABS: 500.0000 mg | ORAL_TABLET | Freq: Two times a day (BID) | ORAL | 0 refills | Status: AC

## 2022-12-02 MED ORDER — DEXAMETHASONE SODIUM PHOSPHATE 10 MG/ML IJ SOLN
10.0000 mg | Freq: Once | INTRAMUSCULAR | Status: AC
  Administered 2022-12-02: 10 mg via INTRAMUSCULAR
  Filled 2022-12-02: qty 1

## 2022-12-02 MED ORDER — ACETAMINOPHEN 500 MG PO TABS: 500.0000 mg | ORAL_TABLET | Freq: Four times a day (QID) | ORAL | 0 refills | Status: AC | PRN

## 2022-12-02 MED ORDER — KETOROLAC TROMETHAMINE 30 MG/ML IJ SOLN
60.0000 mg | Freq: Once | INTRAMUSCULAR | Status: AC
  Administered 2022-12-02: 60 mg via INTRAMUSCULAR
  Filled 2022-12-02: qty 2

## 2022-12-02 MED ORDER — PREDNISONE 10 MG PO TABS: 50.0000 mg | ORAL_TABLET | Freq: Every day | ORAL | 0 refills | Status: AC

## 2022-12-02 NOTE — Discharge Instructions (Addendum)
You were seen in the emergency room for back pain. We suspect that you have lumbar sprain, strain and associated muscle spasms.  Take the medications that are prescribed. If not better in 2 weeks, please call your PCP to see if you need physical therapy  Please follow through on point of care were discussed such as stretching exercises, deep tissue massage, heat and warm compresses as they will be the primary mode of treatment.  Medications are supportive only.

## 2022-12-02 NOTE — ED Triage Notes (Signed)
Pt does have afib,due for ablation, is on eliquis

## 2022-12-02 NOTE — ED Notes (Signed)
Pt given discharge instructions and reviewed prescriptions. Opportunities given for questions. Pt verbalizes understanding. Eveny Anastas R, RN 

## 2022-12-02 NOTE — ED Provider Notes (Addendum)
[place] EMERGENCY DEPARTMENT AT [facility] Provider Note   CSN: [id number] Arrival date & time: 12/02/22  1153     History  Chief Complaint  Patient presents with   Back Pain    Paul Yang is a 62 y.o. male.  HPI    62 year old male comes in with chief complaint of back pain. Patient states that 3 days ago he was stretching to get something off of the cabinet when he felt some muscle spasm and pain.  The next day, he noted that the pain was worsening, he was going to the bathroom and the pain became excruciating and he had to lay down on the floor immediately.  Since then he has continued to relax, but the spasms have persisted.  He is utilizing a walker to get around right now to give himself extra support.  Pain is described as tightness.  There is no associated numbness, tingling and the pain is not radiating to the lower extremity.  No urinary incontinence or retention.  Patient indicates that he has had similar pain in the past and he had to come to the ER and received a shot.  Home Medications Prior to Admission medications   Medication Sig Start Date End Date Taking? Authorizing Provider  acetaminophen (TYLENOL) 500 MG tablet Take 1 tablet (500 mg total) by mouth every 6 (six) hours as needed. 12/02/22  Yes Derwood Kaplan, MD  methocarbamol (ROBAXIN) 500 MG tablet Take 1 tablet (500 mg total) by mouth 2 (two) times daily. 12/02/22  Yes Klani Caridi, MD  predniSONE (DELTASONE) 10 MG tablet Take 5 tablets (50 mg total) by mouth daily. 12/02/22  Yes Shoni Quijas, MD  amLODipine (NORVASC) 10 MG tablet TAKE 1 TABLET BY MOUTH DAILY "OV NEEDED FOR ADDITIONAL REFILLS" 08/08/14   Porfirio Oar, PA  aspirin 81 MG tablet Take 81 mg by mouth daily.    [provider]  atorvastatin (LIPITOR) 80 MG tablet Take 80 mg by mouth daily.    [provider]  clopidogrel (PLAVIX) 75 MG tablet Take by mouth. 03/29/16   [provider]   hydrochlorothiazide (HYDRODIURIL) 12.5 MG tablet Take 1 tablet (12.5 mg total) by mouth daily. 09/09/12 04/19/19  Sondra Barges, PA-C  lisinopril (PRINIVIL,ZESTRIL) 40 MG tablet Take 40 mg by mouth daily.    [provider]  metoprolol tartrate (LOPRESSOR) 25 MG tablet Take by mouth. 03/28/16   [provider]  nitroGLYCERIN (NITROSTAT) 0.4 MG SL tablet Place under the tongue. 03/28/16   [provider]      Allergies    Patient has no known allergies.    Review of Systems   Review of Systems  All other systems reviewed and are negative.   Physical Exam Updated Vital Signs BP (!) 114/96   Pulse 88   Temp 97.6 F (36.4 C) (Oral)   Resp 16   SpO2 96%  Physical Exam Vitals and nursing note reviewed.  Constitutional:      Appearance: He is well-developed.  HENT:     Head: Atraumatic.  Cardiovascular:     Rate and Rhythm: Normal rate.  Pulmonary:     Effort: Pulmonary effort is normal.  Musculoskeletal:        General: Tenderness present.     Cervical back: Neck supple.  Skin:    General: Skin is warm.  Neurological:     Mental Status: He is alert and oriented to person, place, and time.     ED  Results / Procedures / Treatments   Labs (all labs ordered are listed, but only abnormal results are displayed) Labs Reviewed - No data to display  EKG None  Radiology No results found.  Procedures Procedures    Medications Ordered in ED Medications  ketorolac (TORADOL) 30 MG/ML injection 60 mg (60 mg Intramuscular Given 12/02/22 1324)  dexamethasone (DECADRON) injection 10 mg (10 mg Intramuscular Given 12/02/22 1324)  diazepam (VALIUM) tablet 5 mg (5 mg Oral Given 12/02/22 1324)    ED Course/ Medical Decision Making/ A&P                             Medical Decision Making Risk OTC drugs. Prescription drug management.   62 year old male comes in with chief complaint of back pain.  He has history of hypertension, hyperlipidemia, CAD,  A-fib on Eliquis.  Back pain was provoked by stretching, and has persisted since then and gotten worse.  2 days ago he had an episode where the spasm had him on the floor, he had to call even EMS -and since then the pain has persisted.  Differential diagnosis considered for him includes: - DJD of the back - Spondylitises/ spondylosis - Lytic/pathologic fracture - Musculoskeletal pain  Patient has no red flags for cord compression, cauda equina therefore I doubt there is epidural hematoma.  Consider x-ray of the back, however patient's pain is consistent with lumbosacral sprain/strain and he has had x-rays done by chiropractor in the recent past which are reassuring.  Plan is to manage the symptoms conservatively.  We discussed stretching exercises, warm/cold compresses and also deep tissue massage.  He will take adjunct medication therapy.  If he is not improving, he will follow-up with his PCP for physical therapy.  Return precautions discussed.  I have reviewed patient's previous encounters for this visit.  He has not had any recent x-rays.  In the past, he received Toradol shots in the ER.  On 1 occasion he was given prednisone for sciatica.  He no longer can take ibuprofen  -prednisone prescribed for as needed use only for anti-inflammatory effects because he cannot take ibuprofen.  He will start taking them only if he is not responding over the next 2 or 3 days.  Final Clinical Impression(s) / ED Diagnoses Final diagnoses:  Lumbosacral pain    Rx / DC Orders ED Discharge Orders          Ordered    methocarbamol (ROBAXIN) 500 MG tablet  2 times daily        12/02/22 1257    acetaminophen (TYLENOL) 500 MG tablet  Every 6 hours PRN        12/02/22 1257    predniSONE (DELTASONE) 10 MG tablet  Daily        12/02/22 1257              Derwood Kaplan, MD 12/02/22 1402    Derwood Kaplan, MD 12/02/22 1403

## 2022-12-02 NOTE — ED Triage Notes (Signed)
Pt has hx back muscle spasms about 10 years ago, couldn't walk, called 911, in ED he was given an injection and resolved. Thursday, his back started spasms, called 911 to help him up, rested for 2 days, but spasms still continue if he turns a certain way.

## 2023-04-29 ENCOUNTER — Telehealth: Payer: Self-pay | Admitting: Orthopedic Surgery

## 2023-04-29 NOTE — Telephone Encounter (Signed)
Patient called ad left a voicemail he is wanting a 2nd opinion done this week about his ankle.   I called the patient back and he saw  a doctor at [facility] he can't member the doctors name it is a male doctor.  I advised him we will need all the notes from his PCP and the specialist along with the XRAY, CT, MRI on CD along with the reports from the XRAY, CT. MRI.  He states he only has XRAY done, once we have all that information we will give it to the doctors to review and decide if they will see him or not.   He wished me all the holidays and hung up.

## 2023-10-30 ENCOUNTER — Other Ambulatory Visit: Payer: Self-pay

## 2023-10-30 ENCOUNTER — Emergency Department (HOSPITAL_BASED_OUTPATIENT_CLINIC_OR_DEPARTMENT_OTHER)
Admission: EM | Admit: 2023-10-30 | Discharge: 2023-10-30 | Disposition: A | Discharge status: Home / Self Care | Attending: Emergency Medicine | Admitting: Emergency Medicine

## 2023-10-30 ENCOUNTER — Emergency Department (HOSPITAL_BASED_OUTPATIENT_CLINIC_OR_DEPARTMENT_OTHER): Admitting: Radiology

## 2023-10-30 DIAGNOSIS — Z7901 Long term (current) use of anticoagulants: Secondary | ICD-10-CM | POA: Diagnosis not present

## 2023-10-30 DIAGNOSIS — I2510 Atherosclerotic heart disease of native coronary artery without angina pectoris: Secondary | ICD-10-CM | POA: Insufficient documentation

## 2023-10-30 DIAGNOSIS — I10 Essential (primary) hypertension: Secondary | ICD-10-CM | POA: Insufficient documentation

## 2023-10-30 DIAGNOSIS — Z79899 Other long term (current) drug therapy: Secondary | ICD-10-CM | POA: Insufficient documentation

## 2023-10-30 DIAGNOSIS — Z23 Encounter for immunization: Secondary | ICD-10-CM | POA: Diagnosis not present

## 2023-10-30 DIAGNOSIS — Z720 Tobacco use: Secondary | ICD-10-CM | POA: Insufficient documentation

## 2023-10-30 DIAGNOSIS — S61412A Laceration without foreign body of left hand, initial encounter: Secondary | ICD-10-CM

## 2023-10-30 DIAGNOSIS — S6992XA Unspecified injury of left wrist, hand and finger(s), initial encounter: Secondary | ICD-10-CM | POA: Diagnosis present

## 2023-10-30 DIAGNOSIS — W268XXA Contact with other sharp object(s), not elsewhere classified, initial encounter: Secondary | ICD-10-CM | POA: Diagnosis not present

## 2023-10-30 DIAGNOSIS — Z7982 Long term (current) use of aspirin: Secondary | ICD-10-CM | POA: Diagnosis not present

## 2023-10-30 MED ORDER — TETANUS-DIPHTH-ACELL PERTUSSIS 5-2.5-18.5 LF-MCG/0.5 IM SUSY
0.5000 mL | PREFILLED_SYRINGE | Freq: Once | INTRAMUSCULAR | Status: AC
  Administered 2023-10-30: 0.5 mL via INTRAMUSCULAR
  Filled 2023-10-30: qty 0.5

## 2023-10-30 MED ORDER — LIDOCAINE HCL (PF) 1 % IJ SOLN
10.0000 mL | Freq: Once | INTRAMUSCULAR | Status: AC
  Administered 2023-10-30: 10 mL
  Filled 2023-10-30: qty 10

## 2023-10-30 NOTE — ED Triage Notes (Signed)
 Lac to left hand. Bleeding controlled. Unknown tetanus.

## 2023-10-30 NOTE — ED Provider Notes (Signed)
 [name]   CSN: [id number] Arrival date & time: 10/30/23  1623     History  Chief Complaint  Patient presents with   Laceration    Paul Yang is a 62 y.o. male history of hyperlipidemia, hypertension, NSTEMI, CAD, A-fib on Eliquis presents with complaints of laceration to his right hand.  States a piece of metal hit his hand.  No numbness or tingling.   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  Severe stenosis (80%) of the distal RCA treated with 1 DES.  Stenosis (70%) of the mid LAD, and moderate stenosis (60%) of the mid LCx. -->Unclear if LAD dz will require PCI.  Plan was to check outpatient stress test to determine if PCI to the LAD was warranted.  (This was tentatively scheduled for November 15)    Hyperlipidemia    Hypertension    Non-STEMI (non-ST elevated myocardial infarction) [facility]) 03/25/2016   [facility] in [place]  (Dr. Almeda Jacobs): ? if presentation was STEMI or NSTEMI: 100% rPDA (POBA) & 80% dRCA (DES PCI), residual ~70% LAD & 60% Cx.   Tobacco use    Past Surgical History:  Procedure Laterality Date   CORONARY STENT PLACEMENT  03/27/2016   [facility], [place], [place]:: POBA of mRPDA, DES PCI of dRCA (80%)   Left Heart Catheterization  03/27/2016   [facility], [place], [place]: Unable to complete via radial access, switched to right groin. 100% rPDA, 80% dRCA, 70% mLAD, 60% mCx --> POBA/PCI PDA/dRCA   TRANSTHORACIC ECHOCARDIOGRAM  03/27/2016   [facility], [place], [place]:EF of 50-55%, with low normal global systolic left ventricular function. Grade 1 diastolic dysfunction. Mild concentric LVH.Aaron Aas        Home Medications Prior to Admission  medications   Medication Sig Start Date End Date Taking? Authorizing Provider  acetaminophen  (TYLENOL ) 500 MG tablet Take 1 tablet (500 mg total) by mouth every 6 (six) hours as needed. 12/02/22   Deatra Face, MD  amLODipine  (NORVASC ) 10 MG tablet TAKE 1 TABLET BY MOUTH DAILY "OV NEEDED FOR ADDITIONAL REFILLS" 08/08/14   Aldine Humphreys, PA  aspirin 81 MG tablet Take 81 mg by mouth daily.    [provider]  atorvastatin  (LIPITOR) 80 MG tablet Take 80 mg by mouth daily.    [provider]  clopidogrel (PLAVIX) 75 MG tablet Take by mouth. 03/29/16   [provider]  hydrochlorothiazide  (HYDRODIURIL ) 12.5 MG tablet Take 1 tablet (12.5 mg total) by mouth daily. 09/09/12 04/19/19  Roark Chick, PA-C  lisinopril  (PRINIVIL ,ZESTRIL ) 40 MG tablet Take 40 mg by mouth daily.    [provider]  methocarbamol  (ROBAXIN ) 500 MG tablet Take 1 tablet (500 mg total) by mouth 2 (two) times daily. 12/02/22   Deatra Face, MD  metoprolol tartrate (LOPRESSOR) 25 MG tablet Take by mouth. 03/28/16   [provider]  nitroGLYCERIN (NITROSTAT) 0.4 MG SL tablet Place under the tongue. 03/28/16   [provider]  predniSONE  (DELTASONE ) 10 MG tablet Take 5 tablets (50 mg total) by mouth daily. 12/02/22   Deatra Face, MD      Allergies    Patient has no known allergies.    Review of Systems   Review of Systems  Skin:  Positive for wound.    Physical Exam Updated Vital Signs BP (!) 143/77 (BP Location: Left Arm)   Pulse 87   Temp 98.7 F (37.1 C) (Oral)   Resp 18   SpO2 99%  Physical Exam Vitals and nursing note reviewed.  Constitutional:      General: He is not in acute distress.    Appearance: He is well-developed.  HENT:     Head: Normocephalic and atraumatic.  Eyes:     Conjunctiva/sclera: Conjunctivae normal.  Cardiovascular:     Pulses: Normal pulses.  Pulmonary:     Effort: Pulmonary effort is normal. No respiratory distress.   Musculoskeletal:        General: No swelling.     Cervical back: Neck supple.  Skin:    General: Skin is warm and dry.     Capillary Refill: Capillary refill takes less than 2 seconds.     Comments: 2 cm laceration noted to the first webspace with smaller 1 cm superficial laceration noted more proximally of the left hand, no obvious retained foreign body, hemostasis is predominantly achieved at this point, tolerates full range of motion of hand, capable of making a full fist, radial pulses 2+, cap refill less than 2 secs  Neurological:     Mental Status: He is alert.  Psychiatric:        Mood and Affect: Mood normal.     ED Results / Procedures / Treatments   Labs (all labs ordered are listed, but only abnormal results are displayed) Labs Reviewed - No data to display  EKG None  Radiology No results found.  Procedures .Laceration Repair  Date/Time: 10/30/2023 6:42 PM  Performed by: Felicie Horning, PA-C Authorized by: Felicie Horning, PA-C   Consent:    Consent obtained:  Verbal   Consent given by:  Patient   Risks discussed:  Infection and pain   Alternatives discussed:  No treatment Universal protocol:    Procedure explained and questions answered to patient or proxy's satisfaction: yes     Patient identity confirmed:  Verbally with patient and arm band Anesthesia:    Anesthesia method:  Local infiltration   Local anesthetic:  Lidocaine  1% w/o epi Laceration details:    Location:  Hand   Hand location:  L hand, dorsum   Length (cm):  2 Treatment:    Area cleansed with:  Chlorhexidine and saline   Amount of cleaning:  Standard Skin repair:    Repair method:  Sutures and tissue adhesive   Suture size:  4-0   Suture technique:  Horizontal mattress Post-procedure details:    Procedure completion:  Tolerated     Medications Ordered in ED Medications  lidocaine  (PF) (XYLOCAINE ) 1 % injection 10 mL (10 mLs Infiltration Given by Other 10/30/23 1743)  Tdap  (BOOSTRIX ) injection 0.5 mL (0.5 mLs Intramuscular Given 10/30/23 1742)    ED Course/ Medical Decision Making/ A&P                                 Medical Decision Making Amount and/or Complexity of Data Reviewed Radiology: ordered.  Risk Prescription drug management.   This patient presents to the ED with chief complaint(s) of laceration.  The complaint involves an extensive differential diagnosis and also carries with it a high risk of complications and morbidity.   Pertinent past medical history as listed in HPI  The differential diagnosis includes  Simple laceration versus tendon versus vascular versus nerve injury or retained foreign body Additional history obtained: Records reviewed Care Everywhere/External Records  Assessment and management:   Hemodynamically stable patient presented with complaints of laceration to his left hand, sustained a piece of metal.  He is unsure of his tetanus status.  On exam there is a 2 cm laceration to the first webspace with a 1 cm more proximal laceration noted as well.  He is on Eliquis for A-fib, however hemostasis is predominantly achieved at this point.  Capable of making a full fist, radial pulses 2+, cap refill less than 2 secs.  Patient would benefit from primary closure with sutures.  Will update tetanus status.  Independent ECG interpretation:  none  Independent labs interpretation:  The following labs were independently interpreted:  none  Independent visualization and interpretation of imaging: I independently visualized the following imaging with scope of interpretation limited to determining acute life threatening conditions related to emergency care: x-ray without acute osseous abnormality or obvious retained foreign body  Consultations obtained:   none  Disposition:   Patient will be discharged home. The patient has been appropriately medically screened and/or stabilized in the ED. I have low suspicion for any other emergent  medical condition which would require further screening, evaluation or treatment in the ED or require inpatient management. At time of discharge the patient is hemodynamically stable and in no acute distress. I have discussed work-up results and diagnosis with patient and answered all questions. Patient is agreeable with discharge plan. We discussed strict return precautions for returning to the emergency department and they verbalized understanding.     Social Determinants of Health:   none  This note was dictated with voice recognition software.  Despite best efforts at proofreading, errors may have occurred which can change the documentation meaning.          Final Clinical Impression(s) / ED Diagnoses Final diagnoses:  Laceration of left hand without foreign body, initial encounter    Rx / DC Orders ED Discharge Orders     None         Felicie Horning, PA-C 10/30/23 1845    Lowery Rue, DO 10/30/23 1848

## 2023-10-30 NOTE — Discharge Instructions (Addendum)
 You were evaluated in the emergency room for a laceration.  Please return in 7 to 10 days for suture removal.  If you experience any new or worsening symptoms including fevers, chills, worsening redness, pain or swelling please return to the emergency room earlier.

## 2023-11-11 ENCOUNTER — Encounter (HOSPITAL_BASED_OUTPATIENT_CLINIC_OR_DEPARTMENT_OTHER): Payer: Self-pay | Admitting: Emergency Medicine

## 2023-11-11 ENCOUNTER — Other Ambulatory Visit: Payer: Self-pay

## 2023-11-11 ENCOUNTER — Emergency Department (HOSPITAL_BASED_OUTPATIENT_CLINIC_OR_DEPARTMENT_OTHER)
Admission: EM | Admit: 2023-11-11 | Discharge: 2023-11-11 | Discharge status: Against Medical Advice | Attending: Emergency Medicine | Admitting: Emergency Medicine

## 2023-11-11 DIAGNOSIS — Z4802 Encounter for removal of sutures: Secondary | ICD-10-CM | POA: Insufficient documentation

## 2023-11-11 DIAGNOSIS — Z5321 Procedure and treatment not carried out due to patient leaving prior to being seen by health care provider: Secondary | ICD-10-CM | POA: Diagnosis not present

## 2023-11-11 DIAGNOSIS — X58XXXD Exposure to other specified factors, subsequent encounter: Secondary | ICD-10-CM | POA: Diagnosis not present

## 2023-11-11 DIAGNOSIS — S61412D Laceration without foreign body of left hand, subsequent encounter: Secondary | ICD-10-CM | POA: Insufficient documentation

## 2023-11-11 NOTE — ED Notes (Signed)
 Pt states "he needs to get back to work". MD notified. Attempted to have pt wait to be seen. Pt still wanting to leave. VSS on departure. No chest pain or SOB. Pt ambulated to lobby.

## 2023-11-11 NOTE — ED Triage Notes (Signed)
 Needs sutures removed from L hand.
